# Patient Record
Sex: Female | Born: 2000 | Hispanic: No | Marital: Single | Smoking: Never smoker
Health system: Southern US, Community
[De-identification: ages and names within clinical notes are randomized; demographics above are authoritative.]

## PROBLEM LIST (undated history)

## (undated) DIAGNOSIS — D649 Anemia, unspecified: Secondary | ICD-10-CM

## (undated) DIAGNOSIS — O364XX0 Maternal care for intrauterine death, not applicable or unspecified: Secondary | ICD-10-CM

## (undated) HISTORY — DX: Maternal care for intrauterine death, not applicable or unspecified: O36.4XX0

## (undated) HISTORY — DX: Anemia, unspecified: D64.9

## (undated) HISTORY — PX: WISDOM TOOTH EXTRACTION: SHX21

---
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
## 2020-04-02 ENCOUNTER — Telehealth: Payer: Self-pay | Admitting: Family

## 2020-04-02 NOTE — Telephone Encounter (Signed)
Patient called stating she received a call from our office but did not get all the message.  I advised her of upcoming appointments that have been scheduled along with our address & phone number as well.  She voiced complete understanding as well.

## 2020-04-29 ENCOUNTER — Inpatient Hospital Stay: Payer: BC Managed Care – PPO | Attending: Family | Admitting: Family

## 2020-04-29 ENCOUNTER — Other Ambulatory Visit: Payer: Self-pay

## 2020-04-29 ENCOUNTER — Encounter: Payer: Self-pay | Admitting: Family

## 2020-04-29 ENCOUNTER — Other Ambulatory Visit: Payer: Self-pay | Admitting: Family

## 2020-04-29 ENCOUNTER — Inpatient Hospital Stay: Payer: BC Managed Care – PPO

## 2020-04-29 VITALS — BP 125/75 | HR 77 | Resp 18 | Ht <= 58 in | Wt 179.1 lb

## 2020-04-29 DIAGNOSIS — Z79899 Other long term (current) drug therapy: Secondary | ICD-10-CM | POA: Insufficient documentation

## 2020-04-29 DIAGNOSIS — G43909 Migraine, unspecified, not intractable, without status migrainosus: Secondary | ICD-10-CM | POA: Insufficient documentation

## 2020-04-29 DIAGNOSIS — Z885 Allergy status to narcotic agent status: Secondary | ICD-10-CM | POA: Insufficient documentation

## 2020-04-29 DIAGNOSIS — Z888 Allergy status to other drugs, medicaments and biological substances status: Secondary | ICD-10-CM | POA: Diagnosis not present

## 2020-04-29 DIAGNOSIS — R5383 Other fatigue: Secondary | ICD-10-CM | POA: Diagnosis not present

## 2020-04-29 DIAGNOSIS — D509 Iron deficiency anemia, unspecified: Secondary | ICD-10-CM | POA: Insufficient documentation

## 2020-04-29 DIAGNOSIS — D649 Anemia, unspecified: Secondary | ICD-10-CM

## 2020-04-29 DIAGNOSIS — R1031 Right lower quadrant pain: Secondary | ICD-10-CM | POA: Insufficient documentation

## 2020-04-29 DIAGNOSIS — R072 Precordial pain: Secondary | ICD-10-CM | POA: Insufficient documentation

## 2020-04-29 DIAGNOSIS — D500 Iron deficiency anemia secondary to blood loss (chronic): Secondary | ICD-10-CM

## 2020-04-29 LAB — CBC WITH DIFFERENTIAL (CANCER CENTER ONLY)
Abs Immature Granulocytes: 0.05 10*3/uL (ref 0.00–0.07)
Basophils Absolute: 0 10*3/uL (ref 0.0–0.1)
Basophils Relative: 0 %
Eosinophils Absolute: 0.1 10*3/uL (ref 0.0–0.5)
Eosinophils Relative: 1 %
HCT: 37.7 % (ref 36.0–46.0)
Hemoglobin: 11.7 g/dL — ABNORMAL LOW (ref 12.0–15.0)
Immature Granulocytes: 1 %
Lymphocytes Relative: 22 %
Lymphs Abs: 2 10*3/uL (ref 0.7–4.0)
MCH: 23.9 pg — ABNORMAL LOW (ref 26.0–34.0)
MCHC: 31 g/dL (ref 30.0–36.0)
MCV: 76.9 fL — ABNORMAL LOW (ref 80.0–100.0)
Monocytes Absolute: 0.5 10*3/uL (ref 0.1–1.0)
Monocytes Relative: 5 %
Neutro Abs: 6.7 10*3/uL (ref 1.7–7.7)
Neutrophils Relative %: 71 %
Platelet Count: 358 10*3/uL (ref 150–400)
RBC: 4.9 MIL/uL (ref 3.87–5.11)
RDW: 14.3 % (ref 11.5–15.5)
WBC Count: 9.4 10*3/uL (ref 4.0–10.5)
nRBC: 0 % (ref 0.0–0.2)

## 2020-04-29 LAB — CMP (CANCER CENTER ONLY)
ALT: 12 U/L (ref 0–44)
AST: 14 U/L — ABNORMAL LOW (ref 15–41)
Albumin: 4.1 g/dL (ref 3.5–5.0)
Alkaline Phosphatase: 89 U/L (ref 38–126)
Anion gap: 8 (ref 5–15)
BUN: 9 mg/dL (ref 6–20)
CO2: 26 mmol/L (ref 22–32)
Calcium: 9.5 mg/dL (ref 8.9–10.3)
Chloride: 104 mmol/L (ref 98–111)
Creatinine: 0.61 mg/dL (ref 0.44–1.00)
GFR, Estimated: >60 mL/min (ref >60)
Glucose, Bld: 107 mg/dL — ABNORMAL HIGH (ref 70–99)
Potassium: 3.8 mmol/L (ref 3.5–5.1)
Sodium: 138 mmol/L (ref 135–145)
Total Bilirubin: 0.3 mg/dL (ref 0.3–1.2)
Total Protein: 7.7 g/dL (ref 6.5–8.1)

## 2020-04-29 LAB — LACTATE DEHYDROGENASE: LDH: 150 U/L (ref 98–192)

## 2020-04-29 LAB — RETICULOCYTES
Immature Retic Fract: 25.1 % — ABNORMAL HIGH (ref 2.3–15.9)
RBC.: 4.81 MIL/uL (ref 3.87–5.11)
Retic Count, Absolute: 68.8 10*3/uL (ref 19.0–186.0)
Retic Ct Pct: 1.4 % (ref 0.4–3.1)

## 2020-04-29 LAB — SAVE SMEAR(SSMR), FOR PROVIDER SLIDE REVIEW

## 2020-04-29 NOTE — Progress Notes (Signed)
Hematology/Oncology Consultation   Name: Michelle ColasCatherine Knight      MRN: [id number]    Location: Room/bed info not found  Date: 04/29/2020 Time:3:18 PM   REFERRING PHYSICIAN: Carmen C. Mayo, PA-C  REASON FOR CONSULT: Iron deficiency anemia    DIAGNOSIS: Iron deficiency anemia    HISTORY OF PRESENT ILLNESS: Michelle Knight is a very pleasant 19 yo female with history of anemia. Her ferritin with her PCP recently was 4.  She has failed oral iron. No improvement while taking. She was adopted from [place] at 368 months old and has no known family history.  She is symptomatic with fatigue, arms feeling heavy, being cold natured and craving ice.  Her cycle is regular with normal flow. She states that she has been on her same birth control, Yasmin, for 2-3 years.  No other blood loss noted. No abnormal bruising, no petechiae.  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 She states that the autopsy on the still birth was negative.  No personal history of blood clot.  She has problems with frequent persistent migraines and is currently taking Imitrex. She states that she had two days where she had mid sternal chest pain recently. This resolved without intervention and she has had no other issues. I advised that if this returns she contact her PCP for further workup.  She has occasional right lower quadrant abdominal pain. This comes and goes.  She had all 4 wisdom teeth pulled at 19 yo without any complications.  No fever, n/v, cough, rash, dizziness, SOB, palpitations or changes in bowel or bladder habits.  No swelling or tenderness in her extremities.  She has positional numbness and tingling in her hands which she states she feels is due to carpal tunnel syndrome.   She does not smoke cigarettes. Her boyfriend dose smoke so she is exposed second hand. Rare alcohol use, only socially. She has maintained a good appetite and is staying well  hydrated. Her weight is stable.  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   ROS: All other 10 point review of systems is negative.   PAST MEDICAL HISTORY:   No past medical history on file.  ALLERGIES: Allergies  Allergen Reactions  . Other Other (See Comments)    Certain fruits, unsure which ones Grasses Dust mites  . Tramadol Hives      MEDICATIONS:  Current Outpatient Medications on File Prior to Visit  Medication Sig Dispense Refill  . albuterol (VENTOLIN HFA) 108 (90 Base) MCG/ACT inhaler Inhale into the lungs.    . drospirenone-ethinyl estradiol (YASMIN) 3-0.03 MG tablet drospirenone 3 mg-ethinyl estradiol 0.03 mg tablet  Take 1 tablet every day by oral route.    . ergocalciferol (VITAMIN D2) 1.25 MG (50000 UT) capsule Take 1 capsule by mouth once a week.    . escitalopram (LEXAPRO) 10 MG tablet Take by mouth.    . fluticasone (FLONASE) 50 MCG/ACT nasal spray fluticasone propionate 50 mcg/actuation nasal spray,suspension    . misoprostol (CYTOTEC) 200 MCG tablet Take by mouth.    . misoprostol (CYTOTEC) 200 MCG tablet Place vaginally.    . norgestimate-ethinyl estradiol (SPRINTEC 28) 0.25-35 MG-MCG tablet Take 1 tablet by mouth daily.    Marland Kitchen. oxyCODONE (OXY IR/ROXICODONE) 5 MG immediate release tablet Take 5 mg by mouth every 4 (four) hours as needed.    Marland Kitchen. PRENATAL MULTIVIT-MIN-FE-FA PO Take  1 tablet by mouth daily.    . SUMAtriptan (IMITREX) 25 MG tablet Take by mouth.    . topiramate (TOPAMAX) 50 MG tablet SMARTSIG:1 Tablet(s) By Mouth Every Evening    . traMADol (ULTRAM) 50 MG tablet tramadol 50 mg tablet  Take 1 tablet every 6 hours by oral route.    . Vitamin D, Ergocalciferol, (DRISDOL) 1.25 MG (50000 UNIT) CAPS capsule Take 50,000 Units by mouth once a week.     No current facility-administered medications on file prior to visit.     PAST SURGICAL HISTORY Wisdom teeth extraction at 19 years of age  FAMILY HISTORY: No  family history on file.  SOCIAL HISTORY:  reports that she has never smoked. She has never used smokeless tobacco. She reports previous alcohol use. She reports previous drug use.  PERFORMANCE STATUS: The patient's performance status is 1 - Symptomatic but completely ambulatory  PHYSICAL EXAM: Most Recent Vital Signs: Blood pressure 125/75, pulse 77, resp. rate 18, height 4\' 9"  (1.448 m), weight 179 lb 1.9 oz (81.2 kg), SpO2 99 %. BP 125/75 (BP Location: Left Arm, Patient Position: Sitting)   Pulse 77   Resp 18   Ht 4\' 9"  (1.448 m)   Wt 179 lb 1.9 oz (81.2 kg)   SpO2 99%   BMI 38.76 kg/m   General Appearance:    Alert, cooperative, no distress, appears stated age  Head:    Normocephalic, without obvious abnormality, atraumatic  Eyes:    PERRL, conjunctiva/corneas clear, EOM's intact, fundi    benign, both eyes        Throat:   Lips, mucosa, and tongue normal; teeth and gums normal  Neck:   Supple, symmetrical, trachea midline, no adenopathy;    thyroid:  no enlargement/tenderness/nodules; no carotid   bruit or JVD  Back:     Symmetric, no curvature, ROM normal, no CVA tenderness  Lungs:     Clear to auscultation bilaterally, respirations unlabored  Chest Wall:    No tenderness or deformity   Heart:    Regular rate and rhythm, S1 and S2 normal, no murmur, rub   or gallop     Abdomen:     Soft, non-tender, bowel sounds active all four quadrants,    no masses, no organomegaly        Extremities:   Extremities normal, atraumatic, no cyanosis or edema  Pulses:   2+ and symmetric all extremities  Skin:   Skin color, texture, turgor normal, no rashes or lesions  Lymph nodes:   Cervical, supraclavicular, and axillary nodes normal  Neurologic:   CNII-XII intact, normal strength, sensation and reflexes    throughout    LABORATORY DATA:  Results for orders placed or performed in visit on 04/29/20 (from the past 48 hour(s))  CBC with Differential (Cancer Center Only)     Status:  Abnormal   Collection Time: 04/29/20  1:42 PM  Result Value Ref Range   WBC Count 9.4 4.0 - 10.5 K/uL   RBC 4.90 3.87 - 5.11 MIL/uL   Hemoglobin 11.7 (L) 12.0 - 15.0 g/dL   HCT 05/01/20 36 - 46 %   MCV 76.9 (L) 80.0 - 100.0 fL   MCH 23.9 (L) 26.0 - 34.0 pg   MCHC 31.0 30.0 - 36.0 g/dL   RDW 05/01/20 24.4 - 62.8 %   Platelet Count 358 150 - 400 K/uL   nRBC 0.0 0.0 - 0.2 %   Neutrophils Relative % 71 %   Neutro Abs  6.7 1.7 - 7.7 K/uL   Lymphocytes Relative 22 %   Lymphs Abs 2.0 0.7 - 4.0 K/uL   Monocytes Relative 5 %   Monocytes Absolute 0.5 0.1 - 1.0 K/uL   Eosinophils Relative 1 %   Eosinophils Absolute 0.1 0.0 - 0.5 K/uL   Basophils Relative 0 %   Basophils Absolute 0.0 0.0 - 0.1 K/uL   Immature Granulocytes 1 %   Abs Immature Granulocytes 0.05 0.00 - 0.07 K/uL    Comment: Performed at [facility] at [facility], [address] [place], [place] [address]  CMP (Cancer Center only)     Status: Abnormal   Collection Time: 04/29/20  1:42 PM  Result Value Ref Range   Sodium 138 135 - 145 mmol/L   Potassium 3.8 3.5 - 5.1 mmol/L   Chloride 104 98 - 111 mmol/L   CO2 26 22 - 32 mmol/L   Glucose, Bld 107 (H) 70 - 99 mg/dL    Comment: Glucose reference range applies only to samples taken after fasting for at least 8 hours.   BUN 9 6 - 20 mg/dL   Creatinine 6.19 5.09 - 1.00 mg/dL   Calcium 9.5 8.9 - 32.6 mg/dL   Total Protein 7.7 6.5 - 8.1 g/dL   Albumin 4.1 3.5 - 5.0 g/dL   AST 14 (L) 15 - 41 U/L   ALT 12 0 - 44 U/L   Alkaline Phosphatase 89 38 - 126 U/L   Total Bilirubin 0.3 0.3 - 1.2 mg/dL   GFR, Estimated >71 >24 mL/min    Comment: (NOTE) Calculated using the CKD-EPI Creatinine Equation (2021)    Anion gap 8 5 - 15    Comment: Performed at [facility] at [facility], [address] [place], [place] [address]  Reticulocytes     Status: Abnormal   Collection Time: 04/29/20  1:42 PM  Result Value Ref Range   Retic Ct Pct 1.4  0.4 - 3.1 %   RBC. 4.81 3.87 - 5.11 MIL/uL   Retic Count, Absolute 68.8 19.0 - 186.0 K/uL   Immature Retic Fract 25.1 (H) 2.3 - 15.9 %    Comment: Performed at [facility] at [facility], [address], [place], [place] [address]  Save Smear [facility])     Status: None   Collection Time: 04/29/20  1:42 PM  Result Value Ref Range   Smear Review SMEAR STAINED AND AVAILABLE FOR REVIEW     Comment: Performed at [facility] at [facility], [address], [place], [place] [address]  Lactate dehydrogenase (LDH)     Status: None   Collection Time: 04/29/20  1:43 PM  Result Value Ref Range   LDH 150 98 - 192 U/L    Comment: Performed at [facility] Lab at [facility], [address], [place], [place] [address]      RADIOGRAPHY: No results found.     PATHOLOGY: None  ASSESSMENT/PLAN: Michelle Knight is a very pleasant 19 yo female with history of anemia.  Lab work is currently pending.  She could possibly benefit from IV iron as well as a daily folic acid supplement.  We will contact her once results are available to go over treatment plan.  Follow-up in 8 weeks.   All questions were answered and she is in agreement with the plan. She was encouraged to  contact our office with any questions or concerns. We can certainly see her sooner if needed.   The patient was discussed with Dr. Myna Hidalgo and he is in agreement with the aforementioned.   Emeline Gins, NP

## 2020-04-30 ENCOUNTER — Other Ambulatory Visit: Payer: Self-pay | Admitting: Family

## 2020-04-30 DIAGNOSIS — D509 Iron deficiency anemia, unspecified: Secondary | ICD-10-CM | POA: Insufficient documentation

## 2020-04-30 LAB — IRON AND TIBC
Iron: 25 ug/dL — ABNORMAL LOW (ref 41–142)
Saturation Ratios: 4 % — ABNORMAL LOW (ref 21–57)
TIBC: 585 ug/dL — ABNORMAL HIGH (ref 236–444)
UIBC: 560 ug/dL — ABNORMAL HIGH (ref 120–384)

## 2020-04-30 LAB — FERRITIN: Ferritin: <4 ng/mL — ABNORMAL LOW (ref 11–307)

## 2020-04-30 LAB — ERYTHROPOIETIN: Erythropoietin: 35.1 m[IU]/mL — ABNORMAL HIGH (ref 2.6–18.5)

## 2020-05-01 ENCOUNTER — Telehealth: Payer: Self-pay | Admitting: Family

## 2020-05-01 NOTE — Telephone Encounter (Signed)
Called and spoke with patient regarding iron infusion appointments that have been added per 10/26 sch msg.  She was ok with all dates & times scheduled

## 2020-05-05 ENCOUNTER — Other Ambulatory Visit: Payer: Self-pay

## 2020-05-05 ENCOUNTER — Inpatient Hospital Stay: Payer: BC Managed Care – PPO | Attending: Family

## 2020-05-05 VITALS — BP 112/84 | HR 84 | Temp 98.4°F | Resp 18

## 2020-05-05 DIAGNOSIS — D509 Iron deficiency anemia, unspecified: Secondary | ICD-10-CM | POA: Insufficient documentation

## 2020-05-05 MED ORDER — SODIUM CHLORIDE 0.9 % IV SOLN
200.0000 mg | Freq: Once | INTRAVENOUS | Status: AC
  Administered 2020-05-05: 200 mg via INTRAVENOUS
  Filled 2020-05-05: qty 200

## 2020-05-05 MED ORDER — SODIUM CHLORIDE 0.9 % IV SOLN
Freq: Once | INTRAVENOUS | Status: AC
  Filled 2020-05-05: qty 250

## 2020-05-07 ENCOUNTER — Inpatient Hospital Stay: Payer: BC Managed Care – PPO

## 2020-05-07 ENCOUNTER — Other Ambulatory Visit: Payer: Self-pay

## 2020-05-07 VITALS — BP 127/62 | HR 89 | Temp 98.9°F | Resp 18

## 2020-05-07 DIAGNOSIS — D509 Iron deficiency anemia, unspecified: Secondary | ICD-10-CM

## 2020-05-07 MED ORDER — SODIUM CHLORIDE 0.9 % IV SOLN
Freq: Once | INTRAVENOUS | Status: AC
  Filled 2020-05-07: qty 250

## 2020-05-07 MED ORDER — SODIUM CHLORIDE 0.9 % IV SOLN
200.0000 mg | Freq: Once | INTRAVENOUS | Status: AC
  Administered 2020-05-07: 200 mg via INTRAVENOUS
  Filled 2020-05-07: qty 200

## 2020-05-07 NOTE — Patient Instructions (Signed)

## 2020-05-07 NOTE — Progress Notes (Signed)
Pt discharged in no apparent distress. Pt left ambulatory without assistance. Pt aware of discharge instructions and verbalized understanding and had no further questions.  

## 2020-05-13 ENCOUNTER — Other Ambulatory Visit: Payer: Self-pay

## 2020-05-13 ENCOUNTER — Inpatient Hospital Stay: Payer: BC Managed Care – PPO

## 2020-05-13 VITALS — BP 103/70 | HR 70 | Temp 98.1°F | Resp 17

## 2020-05-13 DIAGNOSIS — D509 Iron deficiency anemia, unspecified: Secondary | ICD-10-CM | POA: Diagnosis not present

## 2020-05-13 MED ORDER — SODIUM CHLORIDE 0.9 % IV SOLN
200.0000 mg | Freq: Once | INTRAVENOUS | Status: AC
  Administered 2020-05-13: 200 mg via INTRAVENOUS
  Filled 2020-05-13: qty 200

## 2020-05-13 MED ORDER — SODIUM CHLORIDE 0.9 % IV SOLN
Freq: Once | INTRAVENOUS | Status: AC
  Filled 2020-05-13: qty 250

## 2020-05-13 NOTE — Progress Notes (Signed)
Pt discharged in no apparent distress. Pt left ambulatory without assistance. Pt aware of discharge instructions and verbalized understanding and had no further questions.  

## 2020-05-16 ENCOUNTER — Inpatient Hospital Stay: Payer: BC Managed Care – PPO

## 2020-05-16 ENCOUNTER — Other Ambulatory Visit: Payer: Self-pay

## 2020-05-16 VITALS — BP 98/74 | HR 85 | Temp 99.0°F | Resp 16

## 2020-05-16 DIAGNOSIS — D509 Iron deficiency anemia, unspecified: Secondary | ICD-10-CM

## 2020-05-16 MED ORDER — SODIUM CHLORIDE 0.9 % IV SOLN
200.0000 mg | Freq: Once | INTRAVENOUS | Status: AC
  Administered 2020-05-16: 200 mg via INTRAVENOUS
  Filled 2020-05-16: qty 200

## 2020-05-16 MED ORDER — SODIUM CHLORIDE 0.9 % IV SOLN
Freq: Once | INTRAVENOUS | Status: AC
  Filled 2020-05-16: qty 250

## 2020-05-16 NOTE — Progress Notes (Signed)
Pt discharged in no apparent distress. Pt left ambulatory without assistance. Pt aware of discharge instructions and verbalized understanding and had no further questions.  

## 2020-05-16 NOTE — Patient Instructions (Signed)
Iron Sucrose injection (Venofer)  What is this medicine? IRON SUCROSE (AHY ern SOO krohs) is an iron complex. Iron is used to make healthy red blood cells, which carry oxygen and nutrients throughout the body. This medicine is used to treat iron deficiency anemia in people with chronic kidney disease. This medicine may be used for other purposes; ask your health care provider or pharmacist if you have questions. COMMON BRAND NAME(S): Venofer What should I tell my health care provider before I take this medicine? They need to know if you have any of these conditions:  anemia not caused by low iron levels  heart disease  high levels of iron in the blood  kidney disease  liver disease  an unusual or allergic reaction to iron, other medicines, foods, dyes, or preservatives  pregnant or trying to get pregnant  breast-feeding How should I use this medicine? This medicine is for infusion into a vein. It is given by a health care professional in a hospital or clinic setting. Talk to your pediatrician regarding the use of this medicine in children. While this drug may be prescribed for children as young as 2 years for selected conditions, precautions do apply. Overdosage: If you think you have taken too much of this medicine contact a poison control center or emergency room at once. NOTE: This medicine is only for you. Do not share this medicine with others. What if I miss a dose? It is important not to miss your dose. Call your doctor or health care professional if you are unable to keep an appointment. What may interact with this medicine? Do not take this medicine with any of the following medications:  deferoxamine  dimercaprol  other iron products This medicine may also interact with the following medications:  chloramphenicol  deferasirox This list may not describe all possible interactions. Give your health care provider a list of all the medicines, herbs, non-prescription  drugs, or dietary supplements you use. Also tell them if you smoke, drink alcohol, or use illegal drugs. Some items may interact with your medicine. What should I watch for while using this medicine? Visit your doctor or healthcare professional regularly. Tell your doctor or healthcare professional if your symptoms do not start to get better or if they get worse. You may need blood work done while you are taking this medicine. You may need to follow a special diet. Talk to your doctor. Foods that contain iron include: whole grains/cereals, dried fruits, beans, or peas, leafy green vegetables, and organ meats (liver, kidney). What side effects may I notice from receiving this medicine? Side effects that you should report to your doctor or health care professional as soon as possible:  allergic reactions like skin rash, itching or hives, swelling of the face, lips, or tongue  breathing problems  changes in blood pressure  cough  fast, irregular heartbeat  feeling faint or lightheaded, falls  fever or chills  flushing, sweating, or hot feelings  joint or muscle aches/pains  seizures  swelling of the ankles or feet  unusually weak or tired Side effects that usually do not require medical attention (report to your doctor or health care professional if they continue or are bothersome):  diarrhea  feeling achy  headache  irritation at site where injected  nausea, vomiting  stomach upset  tiredness This list may not describe all possible side effects. Call your doctor for medical advice about side effects. You may report side effects to FDA at 1-800-FDA-1088. Where should   I keep my medicine? This drug is given in a hospital or clinic and will not be stored at home. NOTE: This sheet is a summary. It may not cover all possible information. If you have questions about this medicine, talk to your doctor, pharmacist, or health care provider.  2020 Elsevier/Gold Standard  (2011-04-01 17:14:35)  

## 2020-05-19 ENCOUNTER — Other Ambulatory Visit: Payer: Self-pay

## 2020-05-19 ENCOUNTER — Inpatient Hospital Stay: Payer: BC Managed Care – PPO

## 2020-05-19 VITALS — BP 112/71 | HR 68 | Temp 98.4°F | Resp 17

## 2020-05-19 DIAGNOSIS — D509 Iron deficiency anemia, unspecified: Secondary | ICD-10-CM

## 2020-05-19 MED ORDER — SODIUM CHLORIDE 0.9 % IV SOLN
Freq: Once | INTRAVENOUS | Status: AC
  Filled 2020-05-19: qty 250

## 2020-05-19 MED ORDER — SODIUM CHLORIDE 0.9 % IV SOLN
200.0000 mg | Freq: Once | INTRAVENOUS | Status: AC
  Administered 2020-05-19: 200 mg via INTRAVENOUS
  Filled 2020-05-19: qty 200

## 2020-05-19 NOTE — Progress Notes (Signed)
Pt discharged in no apparent distress. Pt left ambulatory without assistance. Pt aware of discharge instructions and verbalized understanding and had no further questions.  

## 2020-06-30 ENCOUNTER — Other Ambulatory Visit: Payer: Self-pay

## 2020-06-30 ENCOUNTER — Inpatient Hospital Stay: Payer: BC Managed Care – PPO | Attending: Family

## 2020-06-30 ENCOUNTER — Telehealth: Payer: Self-pay | Admitting: Family

## 2020-06-30 ENCOUNTER — Encounter: Payer: Self-pay | Admitting: Family

## 2020-06-30 ENCOUNTER — Inpatient Hospital Stay (HOSPITAL_BASED_OUTPATIENT_CLINIC_OR_DEPARTMENT_OTHER): Payer: BC Managed Care – PPO | Admitting: Family

## 2020-06-30 VITALS — BP 117/66 | HR 60 | Temp 98.1°F | Resp 18 | Ht <= 58 in | Wt 186.4 lb

## 2020-06-30 DIAGNOSIS — R519 Headache, unspecified: Secondary | ICD-10-CM | POA: Insufficient documentation

## 2020-06-30 DIAGNOSIS — Z885 Allergy status to narcotic agent status: Secondary | ICD-10-CM | POA: Insufficient documentation

## 2020-06-30 DIAGNOSIS — Z888 Allergy status to other drugs, medicaments and biological substances status: Secondary | ICD-10-CM | POA: Diagnosis not present

## 2020-06-30 DIAGNOSIS — R0602 Shortness of breath: Secondary | ICD-10-CM | POA: Diagnosis not present

## 2020-06-30 DIAGNOSIS — D509 Iron deficiency anemia, unspecified: Secondary | ICD-10-CM | POA: Insufficient documentation

## 2020-06-30 DIAGNOSIS — Z79899 Other long term (current) drug therapy: Secondary | ICD-10-CM | POA: Insufficient documentation

## 2020-06-30 DIAGNOSIS — D500 Iron deficiency anemia secondary to blood loss (chronic): Secondary | ICD-10-CM

## 2020-06-30 LAB — CBC WITH DIFFERENTIAL (CANCER CENTER ONLY)
Abs Immature Granulocytes: 0.06 10*3/uL (ref 0.00–0.07)
Basophils Absolute: 0 10*3/uL (ref 0.0–0.1)
Basophils Relative: 0 %
Eosinophils Absolute: 0.1 10*3/uL (ref 0.0–0.5)
Eosinophils Relative: 1 %
HCT: 42.1 % (ref 36.0–46.0)
Hemoglobin: 13.8 g/dL (ref 12.0–15.0)
Immature Granulocytes: 1 %
Lymphocytes Relative: 25 %
Lymphs Abs: 2.2 10*3/uL (ref 0.7–4.0)
MCH: 25.7 pg — ABNORMAL LOW (ref 26.0–34.0)
MCHC: 32.8 g/dL (ref 30.0–36.0)
MCV: 78.5 fL — ABNORMAL LOW (ref 80.0–100.0)
Monocytes Absolute: 0.5 10*3/uL (ref 0.1–1.0)
Monocytes Relative: 6 %
Neutro Abs: 6 10*3/uL (ref 1.7–7.7)
Neutrophils Relative %: 67 %
Platelet Count: 255 10*3/uL (ref 150–400)
RBC: 5.36 MIL/uL — ABNORMAL HIGH (ref 3.87–5.11)
RDW: 21.7 % — ABNORMAL HIGH (ref 11.5–15.5)
WBC Count: 9 10*3/uL (ref 4.0–10.5)
nRBC: 0 % (ref 0.0–0.2)

## 2020-06-30 LAB — RETICULOCYTES
Immature Retic Fract: 15.8 % (ref 2.3–15.9)
RBC.: 5.4 MIL/uL — ABNORMAL HIGH (ref 3.87–5.11)
Retic Count, Absolute: 106.9 10*3/uL (ref 19.0–186.0)
Retic Ct Pct: 2 % (ref 0.4–3.1)

## 2020-06-30 NOTE — Progress Notes (Signed)
Hematology and Oncology Follow Up Visit  Michelle Knight [id number] [birth year] 19 y.o. 06/30/2020   Principle Diagnosis:  Iron deficiency anemia   Current Therapy:   IV iron as indicated    Interim History:  Michelle Knight is here today for follow-up. She is feeling much better since receiving IV iron. Her symptoms are much improved.  She has occasional SOB at the end of a long day at work.  No abnormal blood loss. Her last cycle was a month ago. No bruising or petechiae.  No fever, chills, n/v, cough, rash, dizziness, chest pain, palpitations, abdominal pain or changes in bowel or bladder habits.  She has noticed that her headaches have been less often and tolerable.  No swelling, tenderness, numbness or tingling in her extremities.  No falls or syncope.  She has maintained a good appetite and is staying well hydrated. Her weight is stable.   ECOG Performance Status: 1 - Symptomatic but completely ambulatory  Medications:  Allergies as of 06/30/2020      Reactions   Other Itching, Swelling, Other (See Comments)   Certain exotic fruits...mango, dragonfruit, papaya,etc. Tongue swells.   Mixed Grasses Itching   Tramadol Hives      Medication List       Accurate as of June 30, 2020  2:46 PM. If you have any questions, ask your nurse or doctor.        STOP taking these medications   drospirenone-ethinyl estradiol 3-0.03 MG tablet Commonly known as: YASMIN Stopped by: Michelle Gins, NP   misoprostol 200 MCG tablet Commonly known as: CYTOTEC Stopped by: Michelle Gins, NP   oxyCODONE 5 MG immediate release tablet Commonly known as: Oxy IR/ROXICODONE Stopped by: Michelle Gins, NP   PRENATAL MULTIVIT-MIN-FE-FA PO Stopped by: Michelle Gins, NP   traMADol 50 MG tablet Commonly known as: ULTRAM Stopped by: Michelle Gins, NP     TAKE these medications   albuterol 108 (90 Base) MCG/ACT inhaler Commonly known as: VENTOLIN HFA Inhale into the  lungs.   ergocalciferol 1.25 MG (50000 UT) capsule Commonly known as: VITAMIN D2 Take 1 capsule by mouth once a week.   Vitamin D (Ergocalciferol) 1.25 MG (50000 UNIT) Caps capsule Commonly known as: DRISDOL Take 50,000 Units by mouth once a week.   escitalopram 10 MG tablet Commonly known as: LEXAPRO Take by mouth.   fluticasone 50 MCG/ACT nasal spray Commonly known as: FLONASE fluticasone propionate 50 mcg/actuation nasal spray,suspension   norgestimate-ethinyl estradiol 0.25-35 MG-MCG tablet Commonly known as: ORTHO-CYCLEN Take 1 tablet by mouth daily.   SUMAtriptan 25 MG tablet Commonly known as: IMITREX Take by mouth.   topiramate 50 MG tablet Commonly known as: TOPAMAX SMARTSIG:1 Tablet(s) By Mouth Every Evening       Allergies:  Allergies  Allergen Reactions  . Other Itching, Swelling and Other (See Comments)    Certain exotic fruits...mango, dragonfruit, papaya,etc. Tongue swells.  . Mixed Grasses Itching  . Tramadol Hives    Past Medical History, Surgical history, Social history, and Family History were reviewed and updated.  Review of Systems: All other 10 point review of systems is negative.   Physical Exam:  height is 4\' 9"  (1.448 m) and weight is 186 lb 6.4 oz (84.6 kg). Her oral temperature is 98.1 F (36.7 C). Her blood pressure is 117/66 and her pulse is 60. Her respiration is 18 and oxygen saturation is 100%.   Wt Readings from Last 3 Encounters:  06/30/20 186 lb 6.4 oz (84.6 kg)  04/29/20  179 lb 1.9 oz (81.2 kg)    Ocular: Sclerae unicteric, pupils equal, round and reactive to light Ear-nose-throat: Oropharynx clear, dentition fair Lymphatic: No cervical or supraclavicular adenopathy Lungs no rales or rhonchi, good excursion bilaterally Heart regular rate and rhythm, no murmur appreciated Abd soft, nontender, positive bowel sounds MSK no focal spinal tenderness, no joint edema Neuro: non-focal, well-oriented, appropriate  affect Breasts: Deferred   Lab Results  Component Value Date   WBC 9.0 06/30/2020   HGB 13.8 06/30/2020   HCT 42.1 06/30/2020   MCV 78.5 (L) 06/30/2020   PLT 255 06/30/2020   Lab Results  Component Value Date   FERRITIN <4 (L) 04/29/2020   IRON 25 (L) 04/29/2020   TIBC 585 (H) 04/29/2020   UIBC 560 (H) 04/29/2020   IRONPCTSAT 4 (L) 04/29/2020   Lab Results  Component Value Date   RETICCTPCT 2.0 06/30/2020   RBC 5.36 (H) 06/30/2020   RBC 5.40 (H) 06/30/2020   No results found for: KPAFRELGTCHN, LAMBDASER, KAPLAMBRATIO No results found for: IGGSERUM, IGA, IGMSERUM No results found for: Marda Stalker, SPEI   Chemistry      Component Value Date/Time   NA 138 04/29/2020 1342   K 3.8 04/29/2020 1342   CL 104 04/29/2020 1342   CO2 26 04/29/2020 1342   BUN 9 04/29/2020 1342   CREATININE 0.61 04/29/2020 1342      Component Value Date/Time   CALCIUM 9.5 04/29/2020 1342   ALKPHOS 89 04/29/2020 1342   AST 14 (L) 04/29/2020 1342   ALT 12 04/29/2020 1342   BILITOT 0.3 04/29/2020 1342       Impression and Plan: Michelle Knight is a very pleasant 19 yo female with iron deficiency anemia. She has had a nice response to far to IV iron.  We will see what her iron studies look like and replace again if needed.  Follow-up in 3 months.  She was encouraged to contact our office with any questions or concerns.   Michelle Gins, NP 12/27/20212:46 PM

## 2020-06-30 NOTE — Telephone Encounter (Signed)
Appointments scheduled calendar printed & mailed per 12/27 los

## 2020-07-01 LAB — IRON AND TIBC
Iron: 114 ug/dL (ref 41–142)
Saturation Ratios: 30 % (ref 21–57)
TIBC: 383 ug/dL (ref 236–444)
UIBC: 269 ug/dL (ref 120–384)

## 2020-07-01 LAB — FERRITIN: Ferritin: 123 ng/mL (ref 11–307)

## 2020-07-05 NOTE — L&D Delivery Note (Signed)
OB/GYN Faculty Practice Delivery and Post Placental IUD placement Note  Michelle Knight is a 20 y.o. G4P1020 s/p SVD at [redacted]w[redacted]d. She was admitted for IOL for hx of IUFD in prior pregnancy at 38 weeks.   ROM: 8h 51m with clear fluid GBS Status: Neg Maximum Maternal Temperature: 99.6  Labor Progress: Presented for IOL, received cytotecx1 and had a foley balloon placed. Once the foley balloon was out was started on pitocin. Due to some tachysystole her pitocin was stopped and she contracted on her own and progressed to complete.  Delivery Date/Time: 0208 on 06/09/2021 Delivery: Called to room and patient was complete and pushing. Head delivered OA. No nuchal cord present. Shoulder and body delivered in usual fashion. Infant initially with soft spontaneous cry, placed on mother's abdomen, dried and stimulated. Cord clamped x 2 after 1-minute delay, and cut by father of baby. At this time infant stimulated further by RN staff and found to have less tone and starting to grunt and dusky color and he was taken to the warmer approximately 1 min after cord was cut. Ultimately NICU team was called and baby was taken to NICU for respiratory support.   Cord blood drawn. Placenta delivered spontaneously with gentle cord traction. Fundus firm with massage and Pitocin. Due to floor acuity staffing in room being short and attention of staff being needed by baby there was ~5 min delay in placing the post placental IUD after delivery of placenta. Liletta IUD placed with hands by Dr. Ephriam Jenkins. Strings trimmed 1cm inside the introitus. Labia, perineum, vagina, and cervix inspected and found  to have a right periurethral as well as a midline periurethral, both were repaired with interrupted sutures.  Placenta: intact, 3V cord, L&D Complications: None Lacerations: right periurethral EBL: 180cc Analgesia: epidural   Infant: female  APGARs 5,5  weight pending  Warner Mccreedy, MD, MPH OB Fellow, Faculty Practice Center for  [facility], [facility] 06/09/2021, 2:48 AM

## 2020-09-29 ENCOUNTER — Encounter: Payer: Self-pay | Admitting: Family

## 2020-09-29 ENCOUNTER — Inpatient Hospital Stay (HOSPITAL_BASED_OUTPATIENT_CLINIC_OR_DEPARTMENT_OTHER): Payer: BC Managed Care – PPO | Admitting: Family

## 2020-09-29 ENCOUNTER — Other Ambulatory Visit: Payer: Self-pay

## 2020-09-29 ENCOUNTER — Inpatient Hospital Stay: Payer: BC Managed Care – PPO | Attending: Hematology & Oncology

## 2020-09-29 VITALS — BP 104/62 | HR 65 | Temp 98.4°F | Resp 18 | Ht <= 58 in | Wt 187.0 lb

## 2020-09-29 DIAGNOSIS — R42 Dizziness and giddiness: Secondary | ICD-10-CM | POA: Diagnosis not present

## 2020-09-29 DIAGNOSIS — Z888 Allergy status to other drugs, medicaments and biological substances status: Secondary | ICD-10-CM | POA: Diagnosis not present

## 2020-09-29 DIAGNOSIS — R0602 Shortness of breath: Secondary | ICD-10-CM | POA: Insufficient documentation

## 2020-09-29 DIAGNOSIS — Z79899 Other long term (current) drug therapy: Secondary | ICD-10-CM | POA: Diagnosis not present

## 2020-09-29 DIAGNOSIS — R5383 Other fatigue: Secondary | ICD-10-CM | POA: Diagnosis not present

## 2020-09-29 DIAGNOSIS — R200 Anesthesia of skin: Secondary | ICD-10-CM | POA: Diagnosis not present

## 2020-09-29 DIAGNOSIS — D509 Iron deficiency anemia, unspecified: Secondary | ICD-10-CM | POA: Insufficient documentation

## 2020-09-29 DIAGNOSIS — R202 Paresthesia of skin: Secondary | ICD-10-CM | POA: Diagnosis not present

## 2020-09-29 DIAGNOSIS — Z885 Allergy status to narcotic agent status: Secondary | ICD-10-CM | POA: Diagnosis not present

## 2020-09-29 DIAGNOSIS — D500 Iron deficiency anemia secondary to blood loss (chronic): Secondary | ICD-10-CM

## 2020-09-29 LAB — CBC WITH DIFFERENTIAL (CANCER CENTER ONLY)
Abs Immature Granulocytes: 0.16 10*3/uL — ABNORMAL HIGH (ref 0.00–0.07)
Basophils Absolute: 0 10*3/uL (ref 0.0–0.1)
Basophils Relative: 0 %
Eosinophils Absolute: 0.1 10*3/uL (ref 0.0–0.5)
Eosinophils Relative: 1 %
HCT: 44 % (ref 36.0–46.0)
Hemoglobin: 14.3 g/dL (ref 12.0–15.0)
Immature Granulocytes: 2 %
Lymphocytes Relative: 25 %
Lymphs Abs: 2.7 10*3/uL (ref 0.7–4.0)
MCH: 27.9 pg (ref 26.0–34.0)
MCHC: 32.5 g/dL (ref 30.0–36.0)
MCV: 85.9 fL (ref 80.0–100.0)
Monocytes Absolute: 0.8 10*3/uL (ref 0.1–1.0)
Monocytes Relative: 7 %
Neutro Abs: 7.1 10*3/uL (ref 1.7–7.7)
Neutrophils Relative %: 65 %
Platelet Count: 261 10*3/uL (ref 150–400)
RBC: 5.12 MIL/uL — ABNORMAL HIGH (ref 3.87–5.11)
RDW: 12.9 % (ref 11.5–15.5)
WBC Count: 10.9 10*3/uL — ABNORMAL HIGH (ref 4.0–10.5)
nRBC: 0 % (ref 0.0–0.2)

## 2020-09-29 LAB — RETICULOCYTES
Immature Retic Fract: 14.1 % (ref 2.3–15.9)
RBC.: 5.01 MIL/uL (ref 3.87–5.11)
Retic Count, Absolute: 115.7 10*3/uL (ref 19.0–186.0)
Retic Ct Pct: 2.3 % (ref 0.4–3.1)

## 2020-09-29 NOTE — Progress Notes (Signed)
Hematology and Oncology Follow Up Visit  Michelle Knight [id number] October 22, 2000 20 y.o. 09/29/2020   Principle Diagnosis:  Iron deficiency anemia  Current Therapy:        IV iron as indicated    Interim History:  Michelle Knight is here today for follow-up. She is symptomatic with fatigue, dizziness and SOB at times when talking.  Her cycle is regular with normal flow. No other blood loss noted. No bruising or petechiae.  She has positional numbness and tingling in her hands when laying down.  No swelling or tenderness in her extremities. No syncope or falls to report.  No fever, chills, n/v, cough, rash, chest pain, palpitations, abdominal pain or changes in bowel or bladder habits.  She has maintained a good appetite and is staying well hydrated. Her weight is stable at   ECOG Performance Status: 1 - Symptomatic but completely ambulatory  Medications:  Allergies as of 09/29/2020      Reactions   Other Itching, Swelling, Other (See Comments)   Certain exotic fruits...mango, dragonfruit, papaya,etc. Tongue swells.   Mixed Grasses Itching   Tramadol Hives      Medication List       Accurate as of September 29, 2020  1:38 PM. If you have any questions, ask your nurse or doctor.        albuterol 108 (90 Base) MCG/ACT inhaler Commonly known as: VENTOLIN HFA Inhale into the lungs.   ergocalciferol 1.25 MG (50000 UT) capsule Commonly known as: VITAMIN D2 Take 1 capsule by mouth once a week.   Vitamin D (Ergocalciferol) 1.25 MG (50000 UNIT) Caps capsule Commonly known as: DRISDOL Take 50,000 Units by mouth once a week.   escitalopram 10 MG tablet Commonly known as: LEXAPRO Take by mouth.   fluticasone 50 MCG/ACT nasal spray Commonly known as: FLONASE fluticasone propionate 50 mcg/actuation nasal spray,suspension   norgestimate-ethinyl estradiol 0.25-35 MG-MCG tablet Commonly known as: ORTHO-CYCLEN Take 1 tablet by mouth daily.   SUMAtriptan 25 MG tablet Commonly  known as: IMITREX Take by mouth.   topiramate 50 MG tablet Commonly known as: TOPAMAX SMARTSIG:1 Tablet(s) By Mouth Every Evening       Allergies:  Allergies  Allergen Reactions  . Other Itching, Swelling and Other (See Comments)    Certain exotic fruits...mango, dragonfruit, papaya,etc. Tongue swells.  . Mixed Grasses Itching  . Tramadol Hives    Past Medical History, Surgical history, Social history, and Family History were reviewed and updated.  Review of Systems: All other 10 point review of systems is negative.   Physical Exam:  vitals were not taken for this visit.   Wt Readings from Last 3 Encounters:  06/30/20 186 lb 6.4 oz (84.6 kg)  04/29/20 179 lb 1.9 oz (81.2 kg)    Ocular: Sclerae unicteric, pupils equal, round and reactive to light Ear-nose-throat: Oropharynx clear, dentition fair Lymphatic: No cervical or supraclavicular adenopathy Lungs no rales or rhonchi, good excursion bilaterally Heart regular rate and rhythm, no murmur appreciated Abd soft, nontender, positive bowel sounds MSK no focal spinal tenderness, no joint edema Neuro: non-focal, well-oriented, appropriate affect Breasts: Deferred   Lab Results  Component Value Date   WBC 10.9 (H) 09/29/2020   HGB 14.3 09/29/2020   HCT 44.0 09/29/2020   MCV 85.9 09/29/2020   PLT 261 09/29/2020   Lab Results  Component Value Date   FERRITIN 123 06/30/2020   IRON 114 06/30/2020   TIBC 383 06/30/2020   UIBC 269 06/30/2020   IRONPCTSAT 30 06/30/2020  Lab Results  Component Value Date   RETICCTPCT 2.3 09/29/2020   RBC 5.01 09/29/2020   No results found for: KPAFRELGTCHN, LAMBDASER, KAPLAMBRATIO No results found for: IGGSERUM, IGA, IGMSERUM No results found for: Marda Stalker, SPEI   Chemistry      Component Value Date/Time   NA 138 04/29/2020 1342   K 3.8 04/29/2020 1342   CL 104 04/29/2020 1342   CO2 26 04/29/2020 1342   BUN 9  04/29/2020 1342   CREATININE 0.61 04/29/2020 1342      Component Value Date/Time   CALCIUM 9.5 04/29/2020 1342   ALKPHOS 89 04/29/2020 1342   AST 14 (L) 04/29/2020 1342   ALT 12 04/29/2020 1342   BILITOT 0.3 04/29/2020 1342       Impression and Plan: Michelle Knight is a very pleasant 20 yo female with iron deficiency anemia.  Iron studies are pending. We will get her set up for IV iron if needed.  Follow-up in 3 months.  She can contact our office with any questions or concerns.   Emeline Gins, NP 3/28/20221:38 PM

## 2020-09-30 LAB — IRON AND TIBC
Iron: 122 ug/dL (ref 41–142)
Saturation Ratios: 31 % (ref 21–57)
TIBC: 389 ug/dL (ref 236–444)
UIBC: 267 ug/dL (ref 120–384)

## 2020-09-30 LAB — FERRITIN: Ferritin: 81 ng/mL (ref 11–307)

## 2020-11-05 ENCOUNTER — Encounter: Payer: Self-pay | Admitting: *Deleted

## 2020-11-10 DIAGNOSIS — O0990 Supervision of high risk pregnancy, unspecified, unspecified trimester: Secondary | ICD-10-CM | POA: Insufficient documentation

## 2020-11-11 ENCOUNTER — Other Ambulatory Visit: Payer: Self-pay

## 2020-11-11 ENCOUNTER — Ambulatory Visit (INDEPENDENT_AMBULATORY_CARE_PROVIDER_SITE_OTHER): Payer: BC Managed Care – PPO

## 2020-11-11 ENCOUNTER — Other Ambulatory Visit (HOSPITAL_COMMUNITY)
Admission: RE | Admit: 2020-11-11 | Discharge: 2020-11-11 | Disposition: A | Discharge status: Home / Self Care | Payer: BC Managed Care – PPO | Source: Ambulatory Visit

## 2020-11-11 VITALS — BP 110/62 | HR 69 | Wt 185.0 lb

## 2020-11-11 DIAGNOSIS — O09291 Supervision of pregnancy with other poor reproductive or obstetric history, first trimester: Secondary | ICD-10-CM | POA: Diagnosis not present

## 2020-11-11 DIAGNOSIS — Z8759 Personal history of other complications of pregnancy, childbirth and the puerperium: Secondary | ICD-10-CM | POA: Insufficient documentation

## 2020-11-11 DIAGNOSIS — Z3A08 8 weeks gestation of pregnancy: Secondary | ICD-10-CM

## 2020-11-11 DIAGNOSIS — Z8632 Personal history of gestational diabetes: Secondary | ICD-10-CM | POA: Insufficient documentation

## 2020-11-11 DIAGNOSIS — O0990 Supervision of high risk pregnancy, unspecified, unspecified trimester: Secondary | ICD-10-CM

## 2020-11-11 DIAGNOSIS — Z862 Personal history of diseases of the blood and blood-forming organs and certain disorders involving the immune mechanism: Secondary | ICD-10-CM | POA: Insufficient documentation

## 2020-11-11 DIAGNOSIS — Z113 Encounter for screening for infections with a predominantly sexual mode of transmission: Secondary | ICD-10-CM | POA: Insufficient documentation

## 2020-11-11 DIAGNOSIS — O0991 Supervision of high risk pregnancy, unspecified, first trimester: Secondary | ICD-10-CM | POA: Insufficient documentation

## 2020-11-11 DIAGNOSIS — Z8659 Personal history of other mental and behavioral disorders: Secondary | ICD-10-CM | POA: Insufficient documentation

## 2020-11-11 DIAGNOSIS — O09299 Supervision of pregnancy with other poor reproductive or obstetric history, unspecified trimester: Secondary | ICD-10-CM | POA: Insufficient documentation

## 2020-11-11 DIAGNOSIS — F32A Depression, unspecified: Secondary | ICD-10-CM | POA: Insufficient documentation

## 2020-11-11 LAB — HEPATITIS C ANTIBODY: HCV Ab: NEGATIVE

## 2020-11-11 NOTE — Progress Notes (Signed)
PhQ-8 GAD-4.  Pt does want genetic testing

## 2020-11-11 NOTE — Progress Notes (Signed)
Subjective:   Michelle Knight is a 20 y.o. G4P0020 at [redacted]w[redacted]d by LMP, c/w [place] today being seen today for her first obstetrical visit.  Her obstetrical history is significant for obesity and history of GDM and term IUFD and has IDA (iron deficiency anemia); Supervision of high risk pregnancy, antepartum; History of IUFD; History of gestational diabetes in prior pregnancy, currently pregnant; History of anxiety; Depression; and History of iron deficiency anemia on their problem list.. Patient does not intend to breast feed. Pregnancy history fully reviewed.  Patient reports no complaints.  HISTORY: OB History  Gravida Para Term Preterm AB Living  4 1 0 0 2 0  SAB IAB Ectopic Multiple Live Births  2 0 0 0 0    # Outcome Date GA Lbr Len/2nd Weight Sex Delivery Anes PTL Lv  4 Current           3 SAB           2 SAB           1 Para         FD   Past Medical History:  Diagnosis Date  . Anemia   . IUFD at 20 weeks or more of gestation    Past Surgical History:  Procedure Laterality Date  . WISDOM TOOTH EXTRACTION     Family History  Adopted: Yes   Social History   Tobacco Use  . Smoking status: Never Smoker  . Smokeless tobacco: Never Used  Vaping Use  . Vaping Use: Never used  Substance Use Topics  . Alcohol use: Not Currently  . Drug use: Not Currently    Types: Marijuana   Allergies  Allergen Reactions  . Other Itching, Swelling and Other (See Comments)    Certain exotic fruits...mango, dragonfruit, papaya,etc. Tongue swells.  . Mixed Grasses Itching  . Tramadol Hives   Current Outpatient Medications on File Prior to Visit  Medication Sig Dispense Refill  . escitalopram (LEXAPRO) 10 MG tablet Take by mouth.    . fluticasone (FLONASE) 50 MCG/ACT nasal spray     . Prenatal Vit-Fe Fumarate-FA (PRENATAL VITAMINS PO) Take by mouth.    Marland Kitchen VITAMIN D, CHOLECALCIFEROL, PO Take by mouth.    Marland Kitchen albuterol (VENTOLIN HFA) 108 (90 Base) MCG/ACT inhaler Inhale into the lungs.      No current facility-administered medications on file prior to visit.    Indications for ASA therapy (per uptodate) One of the following: Previous pregnancy with preeclampsia, especially early onset and with an adverse outcome No Multifetal gestation No Chronic hypertension No Type 1 or 2 diabetes mellitus No Chronic kidney disease No Autoimmune disease (antiphospholipid syndrome, systemic lupus erythematosus) No  Two or more of the following: Nulliparity No Obesity (body mass index >30 kg/m2) Yes Family history of preeclampsia in mother or sister No Age ?35 years No Sociodemographic characteristics (African American race, low socioeconomic level) No Personal risk factors (eg, previous pregnancy with low birth weight or small for gestational age infant, previous adverse pregnancy outcome [eg, stillbirth], interval >10 years between pregnancies) Yes  Indications for early 1 hour GTT (per uptodate)  BMI >25 (>23 in Asian women) AND one of the following  Gestational diabetes mellitus in a previous pregnancy Yes Glycated hemoglobin ?5.7 percent (39 mmol/mol), impaired glucose tolerance, or impaired fasting glucose on previous testing No First-degree relative with diabetes No High-risk race/ethnicity (eg, African American, Latino, Native American, [place] American, Pacific Islander) No History of cardiovascular disease No Hypertension or  on therapy for hypertension No High-density lipoprotein cholesterol level <35 mg/dL (1.32 mmol/L) and/or a triglyceride level >250 mg/dL (4.40 mmol/L) No Polycystic ovary syndrome No Physical inactivity No Other clinical condition associated with insulin resistance (eg, severe obesity, acanthosis nigricans) No Previous birth of an infant weighing ?4000 g No Previous stillbirth of unknown cause Yes Exam   Vitals:   11/11/20 0827  BP: 110/62  Pulse: 69  Weight: 185 lb (83.9 kg)   Fetal Heart Rate (bpm): 149  Uterus:     Pelvic Exam: Perineum:  deferred   Vulva: deferred   Vagina:  deferred   Cervix: deferred    Adnexa: deferred   Bony Pelvis: deferred  System: General: well-developed, well-nourished female in no acute distress   Breast:  deferred   Skin: normal coloration and turgor, no rashes   Neurologic: oriented, normal, negative, normal mood   Extremities: normal strength, tone, and muscle mass, ROM of all joints is normal   HEENT PERRLA, extraocular movement intact and sclera clear, anicteric   Mouth/Teeth mucous membranes moist, pharynx normal without lesions and dental hygiene good   Neck supple and no masses   Cardiovascular: regular rate and rhythm   Respiratory:  no respiratory distress, normal breath sounds   Abdomen: soft, non-tender; no masses,  no organomegaly     Assessment:   Pregnancy: [id number] Patient Active Problem List   Diagnosis Date Noted  . History of IUFD 11/11/2020  . History of gestational diabetes in prior pregnancy, currently pregnant 11/11/2020  . History of anxiety 11/11/2020  . Depression 11/11/2020  . History of iron deficiency anemia 11/11/2020  . Supervision of high risk pregnancy, antepartum 11/10/2020  . IDA (iron deficiency anemia) 04/30/2020     Plan:   1. Supervision of high risk pregnancy, antepartum - Patient was 15 minutes late to appointment so physical exam and history limited due to time constraints.   - No complaints. Routine care - Patient declines pap today. Reports she had a pap last year. Requested patient obtain records and bring/send copy to office so that we could file to her chart. - Plan to initiate bASA between 12-16 weeks given multiple risk factors for pre-eclampsia - Patient needs early GTT at next appointment given history of GDM and IUFD    - Obstetric panel - HIV antibody (with reflex) - Hepatitis C Antibody - Hemoglobinopathy Evaluation - Culture, OB Urine - Babyscripts Schedule Optimization - [place] bedside; Future - Urine cytology ancillary  only([facility]) - HgB A1c  2. History of IUFD - Term IUFD @ 38 weeks in 2018 - Plan MFM consult as well as serial growth [place] starting at 24 weeks, weekly BPP's starting at 32 weeks  3. [redacted] weeks gestation of pregnancy   4. History of gestational diabetes in prior pregnancy, currently pregnant - Early GTT at next appointment  5. History of anxiety - Currently on Lexapro - Mood stable - Continue to monitor   6. Depression, unspecified depression type   7. History of iron deficiency anemia - Receives iron infusion PRN as ordered by PCP  Initial labs drawn. Continue prenatal vitamins. Discussed and offered genetic screening options, including Quad screen/AFP, NIPS testing, and option to decline testing. Benefits/risks/alternatives reviewed. Pt aware that anatomy US is form of genetic screening with lower accuracy in detecting trisomies than blood work.  Pt chooses/declines genetic screening today. considering NIPS: undecided. Ultrasound discussed; fetal anatomic survey: requested. Problem list reviewed and updated. The nature of [facility]ulty [facility] with multiple MDs and other Advanced Practice Providers was explained to patient; also emphasized that residents, students are part of our team. Routine obstetric precautions reviewed. Return in about 4 weeks (around 12/09/2020) for ROB.     Brand Males, CNM 11/11/20 11:52 AM

## 2020-11-11 NOTE — Progress Notes (Signed)
Bedside U/S shows single IUP with FHT 149 BPM and CRL measures 17.1mm  GA [redacted]w[redacted]d

## 2020-11-12 LAB — URINE CYTOLOGY ANCILLARY ONLY
Chlamydia: NEGATIVE
Comment: NEGATIVE
Comment: NEGATIVE
Comment: NORMAL
Neisseria Gonorrhea: NEGATIVE
Trichomonas: NEGATIVE

## 2020-11-12 LAB — HIV ANTIBODY (ROUTINE TESTING W REFLEX): HIV 1&2 Ab, 4th Generation: NONREACTIVE

## 2020-11-13 LAB — HEMOGLOBINOPATHY EVALUATION
Fetal Hemoglobin Testing: <1 % (ref 0.0–1.9)
HCT: 45.1 % — ABNORMAL HIGH (ref 35.0–45.0)
Hemoglobin A2 - HGBRFX: 2.8 % (ref 2.2–3.2)
Hemoglobin: 14 g/dL (ref 11.7–15.5)
Hgb A: 97.2 % (ref >96.0)
MCH: 27.9 pg (ref 27.0–33.0)
MCV: 90 fL (ref 80.0–100.0)
RBC: 5.01 10*6/uL (ref 3.80–5.10)
RDW: 13 % (ref 11.0–15.0)

## 2020-11-13 LAB — OBSTETRIC PANEL
Absolute Monocytes: 449 cells/uL (ref 200–950)
Antibody Screen: NOT DETECTED
Basophils Absolute: 53 cells/uL (ref 0–200)
Basophils Relative: 0.6 %
Eosinophils Absolute: 79 cells/uL (ref 15–500)
Eosinophils Relative: 0.9 %
HCT: 42.2 % (ref 35.0–45.0)
Hemoglobin: 14.2 g/dL (ref 11.7–15.5)
Hepatitis B Surface Ag: NONREACTIVE
Lymphs Abs: 2033 cells/uL (ref 850–3900)
MCH: 29 pg (ref 27.0–33.0)
MCHC: 33.6 g/dL (ref 32.0–36.0)
MCV: 86.1 fL (ref 80.0–100.0)
MPV: 10.8 fL (ref 7.5–12.5)
Monocytes Relative: 5.1 %
Neutro Abs: 6186 cells/uL (ref 1500–7800)
Neutrophils Relative %: 70.3 %
Platelets: 284 10*3/uL (ref 140–400)
RBC: 4.9 10*6/uL (ref 3.80–5.10)
RDW: 12.8 % (ref 11.0–15.0)
RPR Ser Ql: NONREACTIVE
Rubella: 8.69 Index
Total Lymphocyte: 23.1 %
WBC: 8.8 10*3/uL (ref 3.8–10.8)

## 2020-11-13 LAB — HEMOGLOBIN A1C
Hgb A1c MFr Bld: 5.3 % of total Hgb (ref <5.7)
Mean Plasma Glucose: 105 mg/dL
eAG (mmol/L): 5.8 mmol/L

## 2020-11-13 LAB — CULTURE, OB URINE

## 2020-11-13 LAB — URINE CULTURE, OB REFLEX

## 2020-11-13 LAB — HEPATITIS C ANTIBODY
Hepatitis C Ab: NONREACTIVE
SIGNAL TO CUT-OFF: 0 (ref <1.00)

## 2020-11-17 ENCOUNTER — Other Ambulatory Visit: Payer: Self-pay

## 2020-11-17 ENCOUNTER — Emergency Department (HOSPITAL_BASED_OUTPATIENT_CLINIC_OR_DEPARTMENT_OTHER)
Admission: EM | Admit: 2020-11-17 | Discharge: 2020-11-18 | Disposition: A | Discharge status: Home / Self Care | Payer: BC Managed Care – PPO | Attending: Emergency Medicine | Admitting: Emergency Medicine

## 2020-11-17 ENCOUNTER — Encounter (HOSPITAL_BASED_OUTPATIENT_CLINIC_OR_DEPARTMENT_OTHER): Payer: Self-pay | Admitting: *Deleted

## 2020-11-17 DIAGNOSIS — B349 Viral infection, unspecified: Secondary | ICD-10-CM | POA: Diagnosis not present

## 2020-11-17 DIAGNOSIS — Z20822 Contact with and (suspected) exposure to covid-19: Secondary | ICD-10-CM | POA: Insufficient documentation

## 2020-11-17 DIAGNOSIS — Z3A09 9 weeks gestation of pregnancy: Secondary | ICD-10-CM | POA: Insufficient documentation

## 2020-11-17 DIAGNOSIS — O98511 Other viral diseases complicating pregnancy, first trimester: Secondary | ICD-10-CM | POA: Insufficient documentation

## 2020-11-17 DIAGNOSIS — Z28310 Unvaccinated for covid-19: Secondary | ICD-10-CM | POA: Diagnosis not present

## 2020-11-17 DIAGNOSIS — O26891 Other specified pregnancy related conditions, first trimester: Secondary | ICD-10-CM | POA: Diagnosis present

## 2020-11-17 NOTE — ED Triage Notes (Signed)
Cough x 2 weeks ago. Her Covid and flu test were negative. She was treated with Amoxicillin. She is [redacted] weeks pregnant and is limited in the OTC cold medications she can take.

## 2020-11-18 LAB — RESP PANEL BY RT-PCR (FLU A&B, COVID) ARPGX2
Influenza A by PCR: NEGATIVE
Influenza B by PCR: NEGATIVE
SARS Coronavirus 2 by RT PCR: NEGATIVE

## 2020-11-18 NOTE — ED Provider Notes (Signed)
[facility] EMERGENCY DEPARTMENT Provider Note   CSN: [id number] Arrival date & time: 11/17/20  2134     History Chief Complaint  Patient presents with  . Shortness of Breath    Michelle Knight is a 20 y.o. female.  HPI     This is a 20 year old female who presents with persistent cough and sore throat.  Patient reports over the last 2 weeks she has had upper respiratory symptoms.  She was treated for bronchitis with amoxicillin and finished her course.  She reports negative COVID, influenza, and strep testing on May 9.  She states her last several days she has had increasing sore throat and pain with swallowing.  She reports ongoing shortness of breath and cough.  She has used her inhaler as needed.  No known sick contacts.  She is not vaccinated against COVID-19.  She denies any vaginal bleeding or abdominal pain.  She reports that she is approximately [redacted] weeks pregnant.  No ongoing fevers.  Past Medical History:  Diagnosis Date  . Anemia   . IUFD at 20 weeks or more of gestation     Patient Active Problem List   Diagnosis Date Noted  . History of IUFD 11/11/2020  . History of gestational diabetes in prior pregnancy, currently pregnant 11/11/2020  . History of anxiety 11/11/2020  . Depression 11/11/2020  . History of iron deficiency anemia 11/11/2020  . Supervision of high risk pregnancy, antepartum 11/10/2020  . IDA (iron deficiency anemia) 04/30/2020    Past Surgical History:  Procedure Laterality Date  . WISDOM TOOTH EXTRACTION       OB History    Gravida  4   Para  1   Term      Preterm      AB  2   Living  0     SAB  2   IAB      Ectopic      Multiple      Live Births  0           Family History  Adopted: Yes    Social History   Tobacco Use  . Smoking status: Never Smoker  . Smokeless tobacco: Never Used  Vaping Use  . Vaping Use: Never used  Substance Use Topics  . Alcohol use: Not Currently  . Drug use: Not  Currently    Types: Marijuana    Home Medications Prior to Admission medications   Medication Sig Start Date End Date Taking? Authorizing Provider  albuterol (VENTOLIN HFA) 108 (90 Base) MCG/ACT inhaler Inhale into the lungs. 09/24/19 09/29/20  [provider]  escitalopram (LEXAPRO) 10 MG tablet Take by mouth. 08/20/19   [provider]  fluticasone (FLONASE) 50 MCG/ACT nasal spray     [provider]  Prenatal Vit-Fe Fumarate-FA (PRENATAL VITAMINS PO) Take by mouth.    [provider]  VITAMIN D, CHOLECALCIFEROL, PO Take by mouth.    [provider]    Allergies    Other, Mixed grasses, and Tramadol  Review of Systems   Review of Systems  Constitutional: Negative for fever.  HENT: Positive for congestion and sore throat.   Respiratory: Positive for cough and shortness of breath.   Cardiovascular: Negative for chest pain.  Gastrointestinal: Negative for abdominal pain and nausea.  Genitourinary: Negative for dysuria.  All other systems reviewed and are negative.   Physical Exam Updated Vital Signs BP 115/75 (BP Location: Right Arm)   Pulse (!) 57  Temp 98.1 F (36.7 C) (Oral)   Resp 18   Ht 1.448 m (4\' 9" )   Wt 83 kg   LMP 09/13/2020   SpO2 100%   BMI 39.60 kg/m   Physical Exam Vitals and nursing note reviewed.  Constitutional:      Appearance: She is well-developed. She is obese. She is not ill-appearing.  HENT:     Head: Normocephalic and atraumatic.     Mouth/Throat:     Comments: No posterior oropharyngeal erythema or exudate, uvula midline, no tonsillar swelling noted Eyes:     Pupils: Pupils are equal, round, and reactive to light.  Cardiovascular:     Rate and Rhythm: Normal rate and regular rhythm.     Heart sounds: Normal heart sounds.  Pulmonary:     Effort: Pulmonary effort is normal. No respiratory distress.     Breath sounds: No wheezing.  Abdominal:     General: Bowel sounds are normal.      Palpations: Abdomen is soft.  Musculoskeletal:     Cervical back: Neck supple.     Right lower leg: No tenderness. No edema.     Left lower leg: No tenderness. No edema.  Skin:    General: Skin is warm and dry.  Neurological:     Mental Status: She is alert and oriented to person, place, and time.  Psychiatric:        Mood and Affect: Mood normal.     ED Results / Procedures / Treatments   Labs (all labs ordered are listed, but only abnormal results are displayed) Labs Reviewed  RESP PANEL BY RT-PCR (FLU A&B, COVID) [id number]    EKG None  Radiology No results found.  Procedures Procedures   Medications Ordered in ED Medications - No data to display  ED Course  I have reviewed the triage vital signs and the nursing notes.  Pertinent labs & imaging results that were available during my care of the patient were reviewed by me and considered in my medical decision making (see chart for details).    MDM Rules/Calculators/A&P                          Patient presents with ongoing upper respiratory symptoms including cough and sore throat.  She is overall nontoxic.  She finished a course of antibiotics.  She is afebrile here.  Her oropharyngeal exam is fairly benign.  Additionally her pulmonary exam is without significant wheezing or rhonchi.  She would have been appropriately treated for both strep pharyngitis and pneumonia.  Highly suspect viral etiology.  Will repeat COVID and influenza testing given her pregnancy.  I discussed with her that most likely this was viral in nature.  She does have limited interventions because of her pregnancy.  Tylenol for pain.  She can take honey for cough and continue her inhaler.  COVID and influenza testing are negative.  After history, exam, and medical workup I feel the patient has been appropriately medically screened and is safe for discharge home. Pertinent diagnoses were discussed with the patient. Patient was given return  precautions.  Michelle Knight was evaluated in Emergency Department on 11/18/2020 for the symptoms described in the history of present illness. She was evaluated in the context of the global COVID-19 pandemic, which necessitated consideration that the patient might be at risk for infection with the SARS-CoV-2 virus that causes COVID-19. Institutional protocols and algorithms that pertain to the evaluation of patients at  risk for COVID-19 are in a state of rapid change based on information released by regulatory bodies including the CDC and federal and state organizations. These policies and algorithms were followed during the patient's care in the ED.  Final Clinical Impression(s) / ED Diagnoses Final diagnoses:  Viral illness    Rx / DC Orders ED Discharge Orders    None       Reise Hietala, Mayer Masker, MD 11/18/20 [phone]

## 2020-11-18 NOTE — Discharge Instructions (Addendum)
You were seen today for ongoing cough and sore throat.  Your COVID and influenza testing continue to be negative.  You likely have a virus.  Given your pregnancy, you are limited on over-the-counter medications.  You can take honey for your cough.  Additionally, take Tylenol for any pain control.

## 2020-12-09 ENCOUNTER — Other Ambulatory Visit (HOSPITAL_COMMUNITY)
Admission: RE | Admit: 2020-12-09 | Discharge: 2020-12-09 | Disposition: A | Discharge status: Home / Self Care | Payer: BC Managed Care – PPO | Source: Ambulatory Visit | Attending: Obstetrics and Gynecology | Admitting: Obstetrics and Gynecology

## 2020-12-09 ENCOUNTER — Ambulatory Visit (INDEPENDENT_AMBULATORY_CARE_PROVIDER_SITE_OTHER): Payer: BC Managed Care – PPO | Admitting: Obstetrics and Gynecology

## 2020-12-09 ENCOUNTER — Other Ambulatory Visit: Payer: Self-pay

## 2020-12-09 VITALS — BP 104/68 | HR 69 | Wt 174.0 lb

## 2020-12-09 DIAGNOSIS — Z113 Encounter for screening for infections with a predominantly sexual mode of transmission: Secondary | ICD-10-CM | POA: Insufficient documentation

## 2020-12-09 DIAGNOSIS — Z3A12 12 weeks gestation of pregnancy: Secondary | ICD-10-CM

## 2020-12-09 DIAGNOSIS — Z8759 Personal history of other complications of pregnancy, childbirth and the puerperium: Secondary | ICD-10-CM | POA: Diagnosis not present

## 2020-12-09 DIAGNOSIS — D509 Iron deficiency anemia, unspecified: Secondary | ICD-10-CM | POA: Diagnosis not present

## 2020-12-09 DIAGNOSIS — O0991 Supervision of high risk pregnancy, unspecified, first trimester: Secondary | ICD-10-CM | POA: Diagnosis present

## 2020-12-09 DIAGNOSIS — O99011 Anemia complicating pregnancy, first trimester: Secondary | ICD-10-CM | POA: Diagnosis not present

## 2020-12-09 DIAGNOSIS — O0990 Supervision of high risk pregnancy, unspecified, unspecified trimester: Secondary | ICD-10-CM

## 2020-12-09 NOTE — Patient Instructions (Signed)

## 2020-12-09 NOTE — Progress Notes (Signed)
   PRENATAL VISIT NOTE  Subjective:  Michelle Knight is a 20 y.o. G4P0020 at [redacted]w[redacted]d being seen today for ongoing prenatal care.  She is currently monitored for the following issues for this high-risk pregnancy and has IDA (iron deficiency anemia); Supervision of high risk pregnancy, antepartum; History of IUFD; History of gestational diabetes in prior pregnancy, currently pregnant; History of anxiety; Depression; and History of iron deficiency anemia on their problem list.  Patient reports sciatic nerve pain; bilateral .   . Vag. Bleeding: None.   . Denies leaking of fluid.   The following portions of the patient's history were reviewed and updated as appropriate: allergies, current medications, past family history, past medical history, past social history, past surgical history and problem list.   Objective:   Vitals:   12/09/20 1038  BP: 104/68  Pulse: 69  Weight: 174 lb (78.9 kg)    Fetal Status: Fetal Heart Rate (bpm): 151         General:  Alert, oriented and cooperative. Patient is in no acute distress.  Skin: Skin is warm and dry. No rash noted.   Cardiovascular: Normal heart rate noted  Respiratory: Normal respiratory effort, no problems with respiration noted  Abdomen: Soft, gravid, appropriate for gestational age.        Pelvic: Cervical exam deferred        Extremities: Normal range of motion.  Edema: None  Mental Status: Normal mood and affect. Normal behavior. Normal judgment and thought content.   Assessment and Plan:  Pregnancy: G4P0020 at [redacted]w[redacted]d 1. [redacted] weeks gestation of pregnancy   2. Supervision of high risk pregnancy, antepartum  Start baby asa today.  Genetic screening done today.  MFM [place] odered Pap collected today.  Birth control options printer for her to review.  She is planning on 2 hour GTT at visit on 7/5- she is planning to come to this visit fasting.   Preterm labor symptoms and general obstetric precautions including but not limited to vaginal  bleeding, contractions, leaking of fluid and fetal movement were reviewed in detail with the patient. Please refer to After Visit Summary for other counseling recommendations.   No follow-ups on file.  Future Appointments  Date Time Provider Department Center  12/30/2020 11:15 AM [facility] None  12/30/2020 11:45 AM [place], Brand Males, NP [facility] None  01/06/2021 10:10 AM [name], CNM [id number]    Venia Carbon, NP

## 2020-12-10 LAB — CYTOLOGY - PAP
Adequacy: ABSENT
Chlamydia: NEGATIVE
Comment: NEGATIVE
Comment: NEGATIVE
Comment: NORMAL
Diagnosis: NEGATIVE
Neisseria Gonorrhea: NEGATIVE
Trichomonas: NEGATIVE

## 2020-12-18 ENCOUNTER — Encounter: Payer: Self-pay | Admitting: *Deleted

## 2020-12-18 DIAGNOSIS — O0990 Supervision of high risk pregnancy, unspecified, unspecified trimester: Secondary | ICD-10-CM

## 2020-12-30 ENCOUNTER — Encounter: Payer: Self-pay | Admitting: Family

## 2020-12-30 ENCOUNTER — Inpatient Hospital Stay (HOSPITAL_BASED_OUTPATIENT_CLINIC_OR_DEPARTMENT_OTHER): Payer: BC Managed Care – PPO | Admitting: Family

## 2020-12-30 ENCOUNTER — Other Ambulatory Visit: Payer: Self-pay

## 2020-12-30 ENCOUNTER — Telehealth: Payer: Self-pay

## 2020-12-30 ENCOUNTER — Inpatient Hospital Stay: Payer: BC Managed Care – PPO | Attending: Hematology & Oncology

## 2020-12-30 VITALS — BP 107/70 | HR 82 | Temp 98.8°F | Resp 16 | Ht <= 58 in | Wt 172.0 lb

## 2020-12-30 DIAGNOSIS — O26892 Other specified pregnancy related conditions, second trimester: Secondary | ICD-10-CM | POA: Diagnosis not present

## 2020-12-30 DIAGNOSIS — Z79899 Other long term (current) drug therapy: Secondary | ICD-10-CM | POA: Insufficient documentation

## 2020-12-30 DIAGNOSIS — Z3A00 Weeks of gestation of pregnancy not specified: Secondary | ICD-10-CM | POA: Insufficient documentation

## 2020-12-30 DIAGNOSIS — R5383 Other fatigue: Secondary | ICD-10-CM | POA: Insufficient documentation

## 2020-12-30 DIAGNOSIS — Z885 Allergy status to narcotic agent status: Secondary | ICD-10-CM | POA: Diagnosis not present

## 2020-12-30 DIAGNOSIS — D509 Iron deficiency anemia, unspecified: Secondary | ICD-10-CM | POA: Diagnosis not present

## 2020-12-30 DIAGNOSIS — Z888 Allergy status to other drugs, medicaments and biological substances status: Secondary | ICD-10-CM | POA: Diagnosis not present

## 2020-12-30 DIAGNOSIS — R202 Paresthesia of skin: Secondary | ICD-10-CM | POA: Diagnosis not present

## 2020-12-30 DIAGNOSIS — R110 Nausea: Secondary | ICD-10-CM | POA: Insufficient documentation

## 2020-12-30 DIAGNOSIS — R519 Headache, unspecified: Secondary | ICD-10-CM | POA: Insufficient documentation

## 2020-12-30 DIAGNOSIS — D500 Iron deficiency anemia secondary to blood loss (chronic): Secondary | ICD-10-CM

## 2020-12-30 DIAGNOSIS — O99012 Anemia complicating pregnancy, second trimester: Secondary | ICD-10-CM | POA: Diagnosis present

## 2020-12-30 LAB — RETICULOCYTES
Immature Retic Fract: 14.4 % (ref 2.3–15.9)
RBC.: 4.63 MIL/uL (ref 3.87–5.11)
Retic Count, Absolute: 88.4 10*3/uL (ref 19.0–186.0)
Retic Ct Pct: 1.9 % (ref 0.4–3.1)

## 2020-12-30 LAB — CBC WITH DIFFERENTIAL (CANCER CENTER ONLY)
Abs Immature Granulocytes: 0.14 10*3/uL — ABNORMAL HIGH (ref 0.00–0.07)
Basophils Absolute: 0 10*3/uL (ref 0.0–0.1)
Basophils Relative: 0 %
Eosinophils Absolute: 0.1 10*3/uL (ref 0.0–0.5)
Eosinophils Relative: 1 %
HCT: 38.9 % (ref 36.0–46.0)
Hemoglobin: 13 g/dL (ref 12.0–15.0)
Immature Granulocytes: 1 %
Lymphocytes Relative: 19 %
Lymphs Abs: 2.1 10*3/uL (ref 0.7–4.0)
MCH: 28.3 pg (ref 26.0–34.0)
MCHC: 33.4 g/dL (ref 30.0–36.0)
MCV: 84.7 fL (ref 80.0–100.0)
Monocytes Absolute: 0.6 10*3/uL (ref 0.1–1.0)
Monocytes Relative: 5 %
Neutro Abs: 8.6 10*3/uL — ABNORMAL HIGH (ref 1.7–7.7)
Neutrophils Relative %: 74 %
Platelet Count: 245 10*3/uL (ref 150–400)
RBC: 4.59 MIL/uL (ref 3.87–5.11)
RDW: 12.8 % (ref 11.5–15.5)
WBC Count: 11.5 10*3/uL — ABNORMAL HIGH (ref 4.0–10.5)
nRBC: 0 % (ref 0.0–0.2)

## 2020-12-30 LAB — IRON AND TIBC
Iron: 75 ug/dL (ref 28–170)
Saturation Ratios: 18 % (ref 10.4–31.8)
TIBC: 423 ug/dL (ref 250–450)
UIBC: 348 ug/dL

## 2020-12-30 LAB — FERRITIN: Ferritin: 58 ng/mL (ref 11–307)

## 2020-12-30 NOTE — Progress Notes (Signed)
Hematology and Oncology Follow Up Visit  Michelle Knight [id number] [birth year] 20 y.o. 12/30/2020   Principle Diagnosis:  Iron deficiency anemia  Pregnant due 06/20/2021    Current Therapy:        IV iron as indicated    Interim History:  Michelle Knight is here today for follow-up. She has just entered her second trimester and is pregnant with a sweet baby boy. Her due date is 06/20/2021.  She has not noted any blood loss. No bruising or petechiae.  She notes fatigue, headaches and occasional nausea. The nausea seems to be improving.  No fever, chills, vomiting, cough, rash, dizziness, SOB, chest pain, palpitations, abdominal pain or changes in bowel or bladder habits.  No swelling, tenderness in her extremities. She has intermittent tingling in her fingers.  No falls or syncope.  She has maintained a good appetite and is staying well hydrated. Her weight is stable at 172 lbs.   ECOG Performance Status: 1 - Symptomatic but completely ambulatory  Medications:  Allergies as of 12/30/2020       Reactions   Other Itching, Swelling, Other (See Comments)   Certain exotic fruits...mango, dragonfruit, papaya,etc. Tongue swells.   Mixed Grasses Itching   Tramadol Hives        Medication List        Accurate as of December 30, 2020 12:13 PM. If you have any questions, ask your nurse or doctor.          STOP taking these medications    albuterol 108 (90 Base) MCG/ACT inhaler Commonly known as: VENTOLIN HFA Stopped by: Emeline Gins, NP   ergocalciferol 1.25 MG (50000 UT) capsule Commonly known as: VITAMIN D2 Stopped by: Emeline Gins, NP   escitalopram 10 MG tablet Commonly known as: LEXAPRO Stopped by: Emeline Gins, NP   fluticasone 50 MCG/ACT nasal spray Commonly known as: FLONASE Stopped by: Emeline Gins, NP   VITAMIN D (CHOLECALCIFEROL) PO Stopped by: Emeline Gins, NP       TAKE these medications    aspirin EC 81 MG tablet Take 81 mg by  mouth daily. Swallow whole.   PRENATAL VITAMINS PO Take by mouth.        Allergies:  Allergies  Allergen Reactions   Other Itching, Swelling and Other (See Comments)    Certain exotic fruits...mango, dragonfruit, papaya,etc. Tongue swells.   Mixed Grasses Itching   Tramadol Hives    Past Medical History, Surgical history, Social history, and Family History were reviewed and updated.  Review of Systems: All other 10 point review of systems is negative.   Physical Exam:  height is 4\' 9"  (1.448 m) and weight is 172 lb (78 kg). Her oral temperature is 98.8 F (37.1 C). Her blood pressure is 107/70 and her pulse is 82. Her respiration is 16 and oxygen saturation is 100%.   Wt Readings from Last 3 Encounters:  12/30/20 172 lb (78 kg)  12/09/20 174 lb (78.9 kg)  11/17/20 183 lb (83 kg)    Ocular: Sclerae unicteric, pupils equal, round and reactive to light Ear-nose-throat: Oropharynx clear, dentition fair Lymphatic: No cervical or supraclavicular adenopathy Lungs no rales or rhonchi, good excursion bilaterally Heart regular rate and rhythm, no murmur appreciated Abd soft, nontender, positive bowel sounds MSK no focal spinal tenderness, no joint edema Neuro: non-focal, well-oriented, appropriate affect Breasts: Deferred   Lab Results  Component Value Date   WBC 11.5 (H) 12/30/2020   HGB 13.0 12/30/2020   HCT 38.9 12/30/2020  MCV 84.7 12/30/2020   PLT 245 12/30/2020   Lab Results  Component Value Date   FERRITIN 81 09/29/2020   IRON 122 09/29/2020   TIBC 389 09/29/2020   UIBC 267 09/29/2020   IRONPCTSAT 31 09/29/2020   Lab Results  Component Value Date   RETICCTPCT 1.9 12/30/2020   RBC 4.59 12/30/2020   RBC 4.63 12/30/2020   No results found for: KPAFRELGTCHN, LAMBDASER, KAPLAMBRATIO No results found for: IGGSERUM, IGA, IGMSERUM No results found for: Marda Stalker, SPEI   Chemistry      Component Value  Date/Time   NA 138 04/29/2020 1342   K 3.8 04/29/2020 1342   CL 104 04/29/2020 1342   CO2 26 04/29/2020 1342   BUN 9 04/29/2020 1342   CREATININE 0.61 04/29/2020 1342      Component Value Date/Time   CALCIUM 9.5 04/29/2020 1342   ALKPHOS 89 04/29/2020 1342   AST 14 (L) 04/29/2020 1342   ALT 12 04/29/2020 1342   BILITOT 0.3 04/29/2020 1342       Impression and Plan: Michelle Knight is a very pleasant 20 yo female with iron deficiency anemia.  Iron studies are pending.  Follow-up in 3 months.  She can contact our office with any questions or concerns.   Emeline Gins, NP 6/28/202212:13 PM

## 2021-01-06 ENCOUNTER — Ambulatory Visit (INDEPENDENT_AMBULATORY_CARE_PROVIDER_SITE_OTHER): Payer: BC Managed Care – PPO | Admitting: Nurse Practitioner

## 2021-01-06 ENCOUNTER — Encounter: Payer: Self-pay | Admitting: Nurse Practitioner

## 2021-01-06 ENCOUNTER — Other Ambulatory Visit: Payer: Self-pay

## 2021-01-06 VITALS — BP 111/60 | HR 79 | Wt 169.0 lb

## 2021-01-06 DIAGNOSIS — Z8632 Personal history of gestational diabetes: Secondary | ICD-10-CM

## 2021-01-06 DIAGNOSIS — Z3A16 16 weeks gestation of pregnancy: Secondary | ICD-10-CM

## 2021-01-06 DIAGNOSIS — O09299 Supervision of pregnancy with other poor reproductive or obstetric history, unspecified trimester: Secondary | ICD-10-CM

## 2021-01-06 DIAGNOSIS — Z3480 Encounter for supervision of other normal pregnancy, unspecified trimester: Secondary | ICD-10-CM

## 2021-01-06 NOTE — Progress Notes (Signed)
    Subjective:  Michelle Knight is a 20 y.o. G4P0020 at [redacted]w[redacted]d being seen today for ongoing prenatal care.  She is currently monitored for the following issues for this high-risk pregnancy and has IDA (iron deficiency anemia); Supervision of high risk pregnancy, antepartum; History of IUFD; History of gestational diabetes in prior pregnancy, currently pregnant; History of anxiety; Depression; and History of iron deficiency anemia on their problem list.  Patient reports  near syncopal episode yesterday .   . Vag. Bleeding: None.  Movement: Absent. Denies leaking of fluid.   The following portions of the patient's history were reviewed and updated as appropriate: allergies, current medications, past family history, past medical history, past social history, past surgical history and problem list. Problem list updated.  Objective:   Vitals:   01/06/21 1021  BP: 111/60  Pulse: 79  Weight: 169 lb (76.7 kg)    Fetal Status: Fetal Heart Rate (bpm): 147   Movement: Absent     General:  Alert, oriented and cooperative. Patient is in no acute distress.  Skin: Skin is warm and dry. No rash noted.   Cardiovascular: Normal heart rate noted  Respiratory: Normal respiratory effort, no problems with respiration noted  Abdomen: Soft, gravid, appropriate for gestational age. Pain/Pressure: Absent     Pelvic:  Cervical exam deferred        Extremities: Normal range of motion.  Edema: None  Mental Status: Normal mood and affect. Normal behavior. Normal judgment and thought content.   Urinalysis:      Assessment and Plan:  Pregnancy: G4P0020 at [redacted]w[redacted]d  1. Supervision of other normal pregnancy, antepartum Doing well.  Nausea improving.  Went to the ball game last night and in the heat, got hot and had a near syncopal episode.  Did not lose consciousness.  Was aware the her "vision got darker" and took precautions to sit down.  Medical team at the game assisted her back to her seat when she was feeling  better. Advised if these keep happening to be sure to call and have another visit to address further Has anatomy US scheduled  - Glucose Tolerance, 1 HR (50g) - Alpha fetoprotein, maternal  2. History of gestational diabetes in prior pregnancy, currently pregnant   3. [redacted] weeks gestation of pregnancy   Preterm labor symptoms and general obstetric precautions including but not limited to vaginal bleeding, contractions, leaking of fluid and fetal movement were reviewed in detail with the patient. Please refer to After Visit Summary for other counseling recommendations.  Return in about 4 weeks (around 02/03/2021) for in person ROB.  Nolene Bernheim, RN, MSN, NP-BC Nurse Practitioner, [facility] for [employer], [facility] 01/06/2021 10:39 AM

## 2021-01-07 ENCOUNTER — Telehealth: Payer: Self-pay | Admitting: *Deleted

## 2021-01-07 NOTE — Telephone Encounter (Signed)
Error

## 2021-01-08 ENCOUNTER — Encounter: Payer: Self-pay | Admitting: Nurse Practitioner

## 2021-01-08 DIAGNOSIS — O24419 Gestational diabetes mellitus in pregnancy, unspecified control: Secondary | ICD-10-CM | POA: Insufficient documentation

## 2021-01-15 ENCOUNTER — Other Ambulatory Visit (INDEPENDENT_AMBULATORY_CARE_PROVIDER_SITE_OTHER): Payer: BC Managed Care – PPO | Admitting: *Deleted

## 2021-01-15 ENCOUNTER — Other Ambulatory Visit: Payer: Self-pay

## 2021-01-15 DIAGNOSIS — R7309 Other abnormal glucose: Secondary | ICD-10-CM

## 2021-01-15 NOTE — Progress Notes (Signed)
Pt her for 2 hr GTT due to abnormal 1 hr.

## 2021-01-16 LAB — GLUCOSE TOLERANCE, 1 HOUR (50G) W/O FASTING: Glucose, 1 Hr, gestational: 143 mg/dL — ABNORMAL HIGH (ref 65–139)

## 2021-01-16 LAB — TIQ-MISC

## 2021-01-16 LAB — ALPHA FETOPROTEIN, MATERNAL

## 2021-01-17 LAB — GLUCOSE TOLERANCE, 2 HOURS W/ 1HR
Glucose, 1 hour: 78 mg/dL (ref 65–199)
Glucose, 2 hour: 89 mg/dL (ref 65–139)
Glucose, Fasting: 72 mg/dL (ref 65–99)

## 2021-01-23 LAB — ALPHA FETOPROTEIN, MATERNAL
AFP MoM: 0.91
AFP, Serum: 27.9 ng/mL
Calc'd Gestational Age: 16.4 weeks
Maternal Wt: 169 [lb_av]
Risk for ONTD: <1
Twins-AFP: 1

## 2021-01-28 ENCOUNTER — Ambulatory Visit: Payer: BC Managed Care – PPO

## 2021-01-28 ENCOUNTER — Encounter: Payer: Self-pay | Admitting: *Deleted

## 2021-01-28 ENCOUNTER — Other Ambulatory Visit: Payer: BC Managed Care – PPO

## 2021-01-28 ENCOUNTER — Other Ambulatory Visit: Payer: Self-pay

## 2021-01-28 ENCOUNTER — Other Ambulatory Visit: Payer: Self-pay | Admitting: Obstetrics and Gynecology

## 2021-01-28 ENCOUNTER — Ambulatory Visit: Payer: BC Managed Care – PPO | Attending: Obstetrics and Gynecology

## 2021-01-28 ENCOUNTER — Ambulatory Visit: Payer: BC Managed Care – PPO | Admitting: *Deleted

## 2021-01-28 VITALS — BP 121/60 | HR 87

## 2021-01-28 DIAGNOSIS — Z3A19 19 weeks gestation of pregnancy: Secondary | ICD-10-CM

## 2021-01-28 DIAGNOSIS — O09299 Supervision of pregnancy with other poor reproductive or obstetric history, unspecified trimester: Secondary | ICD-10-CM

## 2021-01-28 DIAGNOSIS — O99012 Anemia complicating pregnancy, second trimester: Secondary | ICD-10-CM | POA: Insufficient documentation

## 2021-01-28 DIAGNOSIS — Z3689 Encounter for other specified antenatal screening: Secondary | ICD-10-CM

## 2021-01-28 DIAGNOSIS — Z8632 Personal history of gestational diabetes: Secondary | ICD-10-CM | POA: Insufficient documentation

## 2021-01-28 DIAGNOSIS — O09292 Supervision of pregnancy with other poor reproductive or obstetric history, second trimester: Secondary | ICD-10-CM

## 2021-01-28 DIAGNOSIS — Z3A12 12 weeks gestation of pregnancy: Secondary | ICD-10-CM

## 2021-01-28 DIAGNOSIS — O99212 Obesity complicating pregnancy, second trimester: Secondary | ICD-10-CM

## 2021-01-28 DIAGNOSIS — O0990 Supervision of high risk pregnancy, unspecified, unspecified trimester: Secondary | ICD-10-CM | POA: Diagnosis present

## 2021-01-28 DIAGNOSIS — Z363 Encounter for antenatal screening for malformations: Secondary | ICD-10-CM | POA: Diagnosis present

## 2021-01-29 ENCOUNTER — Other Ambulatory Visit: Payer: Self-pay | Admitting: *Deleted

## 2021-01-29 DIAGNOSIS — O09299 Supervision of pregnancy with other poor reproductive or obstetric history, unspecified trimester: Secondary | ICD-10-CM

## 2021-02-05 ENCOUNTER — Other Ambulatory Visit: Payer: Self-pay

## 2021-02-05 ENCOUNTER — Ambulatory Visit (INDEPENDENT_AMBULATORY_CARE_PROVIDER_SITE_OTHER): Payer: BC Managed Care – PPO | Admitting: Obstetrics and Gynecology

## 2021-02-05 ENCOUNTER — Encounter: Payer: Self-pay | Admitting: Obstetrics and Gynecology

## 2021-02-05 VITALS — BP 110/73 | HR 93 | Wt 167.0 lb

## 2021-02-05 DIAGNOSIS — Z3480 Encounter for supervision of other normal pregnancy, unspecified trimester: Secondary | ICD-10-CM

## 2021-02-05 DIAGNOSIS — Z8759 Personal history of other complications of pregnancy, childbirth and the puerperium: Secondary | ICD-10-CM

## 2021-02-05 DIAGNOSIS — O0990 Supervision of high risk pregnancy, unspecified, unspecified trimester: Secondary | ICD-10-CM

## 2021-02-05 DIAGNOSIS — Z8632 Personal history of gestational diabetes: Secondary | ICD-10-CM

## 2021-02-05 DIAGNOSIS — O09299 Supervision of pregnancy with other poor reproductive or obstetric history, unspecified trimester: Secondary | ICD-10-CM

## 2021-02-05 DIAGNOSIS — O99210 Obesity complicating pregnancy, unspecified trimester: Secondary | ICD-10-CM | POA: Insufficient documentation

## 2021-02-05 NOTE — Progress Notes (Signed)
   PRENATAL VISIT NOTE  Subjective:  Michelle Knight is a 20 y.o. G4P0020 at [redacted]w[redacted]d being seen today for ongoing prenatal care.  She is currently monitored for the following issues for this high-risk pregnancy and has IDA (iron deficiency anemia); Supervision of high risk pregnancy, antepartum; History of IUFD; History of gestational diabetes in prior pregnancy, currently pregnant; History of anxiety; Depression; History of iron deficiency anemia; Gestational diabetes; and Maternal obesity affecting pregnancy, antepartum on their problem list.  Patient reports no complaints.  Contractions: Not present. Vag. Bleeding: None.  Movement: Present. Denies leaking of fluid.   The following portions of the patient's history were reviewed and updated as appropriate: allergies, current medications, past family history, past medical history, past social history, past surgical history and problem list.   Objective:   Vitals:   02/05/21 0957  BP: 110/73  Pulse: 93  Weight: 167 lb (75.8 kg)    Fetal Status: Fetal Heart Rate (bpm): 147 Fundal Height: 21 cm Movement: Present     General:  Alert, oriented and cooperative. Patient is in no acute distress.  Skin: Skin is warm and dry. No rash noted.   Cardiovascular: Normal heart rate noted  Respiratory: Normal respiratory effort, no problems with respiration noted  Abdomen: Soft, gravid, appropriate for gestational age.  Pain/Pressure: Present     Pelvic: Cervical exam deferred        Extremities: Normal range of motion.  Edema: None  Mental Status: Normal mood and affect. Normal behavior. Normal judgment and thought content.   Assessment and Plan:  Pregnancy: G4P0020 at [redacted]w[redacted]d 1. Supervision of other normal pregnancy, antepartum Patient is doing well without complaints AFP today Follow up ultrasound to complete anatomy scheduled  - Alpha fetoprotein, maternal  2. Supervision of high risk pregnancy, antepartum   3. History of gestational  diabetes in prior pregnancy, currently pregnant Normal early glucola Patient aware of need for repeat 2 hour at 26-28 weeks  4. History of IUFD   5. Maternal obesity affecting pregnancy, antepartum Continue ASA daily  Preterm labor symptoms and general obstetric precautions including but not limited to vaginal bleeding, contractions, leaking of fluid and fetal movement were reviewed in detail with the patient. Please refer to After Visit Summary for other counseling recommendations.   Return in about 1 week (around 02/12/2021) for in person, ROB, High risk.  Future Appointments  Date Time Provider Department Center  02/26/2021 11:15 AM [facility] NURSE [facility]  02/26/2021 11:30 AM [facility] US3 [facility]  02/26/2021 12:00 PM [facility] MD [facility]  03/05/2021 11:10 AM Milas Hock, MD [facility]  04/01/2021 10:15 AM [facility] LAB [facility] None  04/01/2021 10:45 AM [place], Brand Males, NP [facility] None    Catalina Antigua, MD

## 2021-02-06 LAB — ALPHA FETOPROTEIN, MATERNAL
AFP MoM: 0.81
AFP, Serum: 47.1 ng/mL
Calc'd Gestational Age: 20.7 weeks
Maternal Wt: 169 [lb_av]
Risk for ONTD: <1
Twins-AFP: 1

## 2021-02-26 ENCOUNTER — Other Ambulatory Visit: Payer: Self-pay | Admitting: Obstetrics and Gynecology

## 2021-02-26 ENCOUNTER — Other Ambulatory Visit: Payer: Self-pay

## 2021-02-26 ENCOUNTER — Ambulatory Visit: Payer: BC Managed Care – PPO | Admitting: *Deleted

## 2021-02-26 ENCOUNTER — Ambulatory Visit (HOSPITAL_BASED_OUTPATIENT_CLINIC_OR_DEPARTMENT_OTHER): Payer: BC Managed Care – PPO | Admitting: Obstetrics and Gynecology

## 2021-02-26 ENCOUNTER — Ambulatory Visit: Payer: BC Managed Care – PPO | Attending: Obstetrics and Gynecology

## 2021-02-26 ENCOUNTER — Encounter: Payer: Self-pay | Admitting: *Deleted

## 2021-02-26 VITALS — BP 105/60 | HR 79

## 2021-02-26 DIAGNOSIS — O09292 Supervision of pregnancy with other poor reproductive or obstetric history, second trimester: Secondary | ICD-10-CM

## 2021-02-26 DIAGNOSIS — Z3A23 23 weeks gestation of pregnancy: Secondary | ICD-10-CM | POA: Diagnosis not present

## 2021-02-26 DIAGNOSIS — E669 Obesity, unspecified: Secondary | ICD-10-CM

## 2021-02-26 DIAGNOSIS — O99210 Obesity complicating pregnancy, unspecified trimester: Secondary | ICD-10-CM

## 2021-02-26 DIAGNOSIS — O99212 Obesity complicating pregnancy, second trimester: Secondary | ICD-10-CM | POA: Diagnosis not present

## 2021-02-26 DIAGNOSIS — O99012 Anemia complicating pregnancy, second trimester: Secondary | ICD-10-CM

## 2021-02-26 DIAGNOSIS — O09299 Supervision of pregnancy with other poor reproductive or obstetric history, unspecified trimester: Secondary | ICD-10-CM | POA: Diagnosis present

## 2021-02-26 DIAGNOSIS — Z8632 Personal history of gestational diabetes: Secondary | ICD-10-CM

## 2021-02-26 DIAGNOSIS — Z48817 Encounter for surgical aftercare following surgery on the skin and subcutaneous tissue: Secondary | ICD-10-CM

## 2021-02-26 DIAGNOSIS — D649 Anemia, unspecified: Secondary | ICD-10-CM

## 2021-02-26 DIAGNOSIS — Z362 Encounter for other antenatal screening follow-up: Secondary | ICD-10-CM | POA: Diagnosis not present

## 2021-02-26 NOTE — Progress Notes (Signed)
Maternal-Fetal Medicine   Name: Michelle Knight DOB: [birth year] MRN: [id number] Referring Provider: Catalina Antigua, MD  I had the pleasure of seeing Ms. Walderp [facility] at the [facility].  She is here for follow-up fetal growth assessment and consultation.  Obstetric history significant for an intrauterine fetal death at [redacted] weeks gestation.  In April 2018, at [redacted] weeks gestation patient had preterm premature rupture of membranes.  She had decreased fetal movements before rupture of membranes.  On ultrasound evaluation fetal demise was confirmed.  Minimal ascites was seen on ultrasound.  Her pregnancy was complicated by gestational diabetes and the patient was not checking her blood glucose regularly.  Following diagnosis of intrauterine demise, she underwent induction of labor and delivered a female infant weighing 2,310 g at birth.  Fetal autopsy showed no evidence of abnormalities.  Placenta weighed 353 g with acute chorioamnionitis.  Three-vessel cord was seen.  Placental cord insertion was central.  There is a 1.1 cm hemorrhagic area suspicious of infarct.  No evidence of retroplacental clot. Acquired thrombophilia work-up was negative. No information is available on fetal karyotype or microarray. Past medical history: No history of diabetes or hypertension or any chronic medical conditions.  Patient reports she has anemia and had iron transfusions. Past surgical history: Nil of note. Medications: Prenatal vitamins. Allergies: Tramadol (hives). Social history: Denies tobacco or drug or alcohol use.  Patient states she is American [place].  Her partner is Caucasian, and he is in good health.  He is now the father of her first pregnancy.  Prenatal: On cell free fetal DNA screening, the risks of fetal aneuploidies are not increased.  MSAFP screening showed low risk for open neural tube defects. Early screening ruled out gestational diabetes.  Ultrasound Fetal growth is  appropriate for gestational age.  Amniotic fluid is normal and good fetal activity seen.  Fetal spine appears normal and anatomical survey was completed.  Labs: Hemoglobin 10.2, hematocrit 31.4.  Our concerns include History of stillbirth Stillbirths occur in about 6 per 1000 births in the [place] and the cause is unknown in about 50% of cases. Fetal autopsy did not show any anomalies. Placental histology was unremarkable except chorioamnionitis. She also did not have vaginal bleeding in her previous pregnancy to suggest abruption.   It is likely she had poorly-controlled diabetes that would have caused stillbirth. I explained the importance of good blood glucose control to prevent adverse fetal outcomes.   Gestational diabetes (GDM) is more common in women with history of GDM. It is reassuring to note that early screening ruled out GDM. If she develops GDM later in this pregnancy, good control is essential.  I counseled her that in the absence of any clear etiology, we would expect good outcomes.  I discussed our ultrasound protocol of serial fetal growth scans and initiating weekly antenatal testing at 32-34 weeks.   Timing of delivery: Provided antenatal testing is reassuring and she does not have other complications including gestational hypertension/preeclampsia/poorly-controlled GDM, she can be delivered at 74 weeks' gestation.  Recommendations -An appointment was made for her to return in 4 weeks for fetal growth assessment. Fetal growth assessment every 4 weeks. Weekly BPP from [redacted] weeks gestation till delivery.  Thank you for consultation.  If you have any questions or concerns, please contact me the [facility].  Consultation including face-to-face (more than 50%) counseling 30 minutes.

## 2021-03-04 ENCOUNTER — Encounter: Payer: Self-pay | Admitting: Obstetrics and Gynecology

## 2021-03-05 ENCOUNTER — Other Ambulatory Visit: Payer: Self-pay

## 2021-03-05 ENCOUNTER — Ambulatory Visit (INDEPENDENT_AMBULATORY_CARE_PROVIDER_SITE_OTHER): Payer: BC Managed Care – PPO | Admitting: Obstetrics and Gynecology

## 2021-03-05 ENCOUNTER — Encounter: Payer: Self-pay | Admitting: Obstetrics and Gynecology

## 2021-03-05 VITALS — BP 117/65 | HR 73 | Wt 169.0 lb

## 2021-03-05 DIAGNOSIS — Z8759 Personal history of other complications of pregnancy, childbirth and the puerperium: Secondary | ICD-10-CM

## 2021-03-05 DIAGNOSIS — O0990 Supervision of high risk pregnancy, unspecified, unspecified trimester: Secondary | ICD-10-CM

## 2021-03-05 DIAGNOSIS — O09299 Supervision of pregnancy with other poor reproductive or obstetric history, unspecified trimester: Secondary | ICD-10-CM

## 2021-03-05 DIAGNOSIS — Z8632 Personal history of gestational diabetes: Secondary | ICD-10-CM

## 2021-03-05 NOTE — Progress Notes (Signed)
   PRENATAL VISIT NOTE  Subjective:  Manal Kreutzer is a 20 y.o. G4P1020 at [redacted]w[redacted]d being seen today for ongoing prenatal care.  She is currently monitored for the following issues for this high-risk pregnancy and has IDA (iron deficiency anemia); Supervision of high risk pregnancy, antepartum; History of IUFD; History of gestational diabetes in prior pregnancy, currently pregnant; History of anxiety; Depression; Gestational diabetes; and Maternal obesity affecting pregnancy, antepartum on their problem list.  Patient reports occasional contractions.  Contractions: Irritability. Vag. Bleeding: None.  Movement: Present. Denies leaking of fluid.   The following portions of the patient's history were reviewed and updated as appropriate: allergies, current medications, past family history, past medical history, past social history, past surgical history and problem list.   Objective:   Vitals:   03/05/21 1121  BP: 117/65  Pulse: 73  Weight: 169 lb (76.7 kg)    Fetal Status: Fetal Heart Rate (bpm): 137 Fundal Height: 24 cm Movement: Present     General:  Alert, oriented and cooperative. Patient is in no acute distress.  Skin: Skin is warm and dry. No rash noted.   Cardiovascular: Normal heart rate noted  Respiratory: Normal respiratory effort, no problems with respiration noted  Abdomen: Soft, gravid, appropriate for gestational age.  Pain/Pressure: Present     Pelvic: Cervical exam deferred        Extremities: Normal range of motion.  Edema: None  Mental Status: Normal mood and affect. Normal behavior. Normal judgment and thought content.   Assessment and Plan:  Pregnancy: G4P1020 at [redacted]w[redacted]d 1. Supervision of high risk pregnancy, antepartum - Prenatal care and labs up to date - f/u in 4 weeks - routine 28w labs at that time - Discussed tdap  2. History of IUFD - She will continue serial growth [place] - next scheduled for 9/29 - Weekly BPP and NST at 32w until delivery - Plan for  delivery at 39w  3. History of gestational diabetes in prior pregnancy, currently pregnant - Early abnormal 1 hr, but normal 2hr - Routine testing next visit  Preterm labor symptoms and general obstetric precautions including but not limited to vaginal bleeding, contractions, leaking of fluid and fetal movement were reviewed in detail with the patient. Please refer to After Visit Summary for other counseling recommendations.   Return in about 4 weeks (around 04/02/2021) for 2 hr GTT.  Future Appointments  Date Time Provider Department Center  04/01/2021 10:15 AM [facility] LAB [facility] None  04/01/2021 10:45 AM [place], Brand Males, NP [facility] None  04/02/2021  8:50 AM Milas Hock, MD [facility]  04/02/2021  1:00 PM [facility] NURSE [facility]  04/02/2021  1:15 PM [facility] US2 [facility]    Milas Hock, MD

## 2021-03-31 NOTE — Progress Notes (Signed)
   PRENATAL VISIT NOTE  Subjective:  Michelle Knight is a 20 y.o. G4P1020 at [redacted]w[redacted]d being seen today for ongoing prenatal care.  She is currently monitored for the following issues for this high-risk pregnancy and has IDA (iron deficiency anemia); Supervision of high risk pregnancy, antepartum; History of IUFD; History of gestational diabetes in prior pregnancy, currently pregnant; History of anxiety; Depression; Gestational diabetes; and Maternal obesity affecting pregnancy, antepartum on their problem list.  Patient reports fatigue.  Contractions: Irritability. Vag. Bleeding: None.  Movement: Present. Denies leaking of fluid.   The following portions of the patient's history were reviewed and updated as appropriate: allergies, current medications, past family history, past medical history, past social history, past surgical history and problem list.   Objective:   Vitals:   04/02/21 0901  BP: 104/69  Pulse: 86  Weight: 171 lb (77.6 kg)    Fetal Status: Fetal Heart Rate (bpm): 139 Fundal Height: 28 cm Movement: Present     General:  Alert, oriented and cooperative. Patient is in no acute distress.  Skin: Skin is warm and dry. No rash noted.   Cardiovascular: Normal heart rate noted  Respiratory: Normal respiratory effort, no problems with respiration noted  Abdomen: Soft, gravid, appropriate for gestational age.  Pain/Pressure: Absent     Pelvic: Cervical exam deferred        Extremities: Normal range of motion.  Edema: None  Mental Status: Normal mood and affect. Normal behavior. Normal judgment and thought content.   Assessment and Plan:  Pregnancy: G4P1020 at [redacted]w[redacted]d 1. Supervision of high risk pregnancy, antepartum - 28w labs today - Tdap offered and she accepts - Discussed flu shot -- she would like to get it next time  2. History of IUFD - Weekly BPP and NST until delivery starting at 32 wks - Growth q4 wks - done 9/29  3. History of gestational diabetes in prior  pregnancy, currently pregnant - Early 1 hr abnormal but normal 2 hr at that time - Doing 2 hr labs today. We discussed if normal, I would suggest still adhering closely to a DM diet. She reports she is eating minimal carbs at this time as well. TWG -7lbs  4. Maternal obesity affecting pregnancy, antepartum   Preterm labor symptoms and general obstetric precautions including but not limited to vaginal bleeding, contractions, leaking of fluid and fetal movement were reviewed in detail with the patient. Please refer to After Visit Summary for other counseling recommendations.   Return in about 2 weeks (around 04/16/2021) for OB VISIT, MD only.  Future Appointments  Date Time Provider Department Center  04/02/2021  1:00 PM [facility] NURSE [facility]  04/02/2021  1:15 PM [facility] US2 [facility]  05/04/2021 11:15 AM [facility] LAB [facility] None  05/04/2021 11:45 AM Erenest Blank, NP [facility] None    Milas Hock, MD

## 2021-04-01 ENCOUNTER — Inpatient Hospital Stay: Payer: BC Managed Care – PPO

## 2021-04-01 ENCOUNTER — Other Ambulatory Visit: Payer: Self-pay

## 2021-04-01 ENCOUNTER — Inpatient Hospital Stay: Payer: BC Managed Care – PPO | Admitting: Family

## 2021-04-02 ENCOUNTER — Ambulatory Visit: Payer: BC Managed Care – PPO | Attending: Obstetrics and Gynecology

## 2021-04-02 ENCOUNTER — Ambulatory Visit (INDEPENDENT_AMBULATORY_CARE_PROVIDER_SITE_OTHER): Payer: BC Managed Care – PPO | Admitting: Obstetrics and Gynecology

## 2021-04-02 ENCOUNTER — Encounter: Payer: Self-pay | Admitting: *Deleted

## 2021-04-02 ENCOUNTER — Ambulatory Visit: Payer: BC Managed Care – PPO | Admitting: *Deleted

## 2021-04-02 ENCOUNTER — Encounter: Payer: Self-pay | Admitting: Obstetrics and Gynecology

## 2021-04-02 VITALS — BP 104/69 | HR 86 | Wt 171.0 lb

## 2021-04-02 VITALS — BP 121/56 | HR 78

## 2021-04-02 DIAGNOSIS — O99012 Anemia complicating pregnancy, second trimester: Secondary | ICD-10-CM | POA: Insufficient documentation

## 2021-04-02 DIAGNOSIS — O99013 Anemia complicating pregnancy, third trimester: Secondary | ICD-10-CM

## 2021-04-02 DIAGNOSIS — O09299 Supervision of pregnancy with other poor reproductive or obstetric history, unspecified trimester: Secondary | ICD-10-CM

## 2021-04-02 DIAGNOSIS — O0990 Supervision of high risk pregnancy, unspecified, unspecified trimester: Secondary | ICD-10-CM

## 2021-04-02 DIAGNOSIS — Z23 Encounter for immunization: Secondary | ICD-10-CM

## 2021-04-02 DIAGNOSIS — Z362 Encounter for other antenatal screening follow-up: Secondary | ICD-10-CM | POA: Diagnosis present

## 2021-04-02 DIAGNOSIS — Z3A28 28 weeks gestation of pregnancy: Secondary | ICD-10-CM

## 2021-04-02 DIAGNOSIS — O99210 Obesity complicating pregnancy, unspecified trimester: Secondary | ICD-10-CM

## 2021-04-02 DIAGNOSIS — O09293 Supervision of pregnancy with other poor reproductive or obstetric history, third trimester: Secondary | ICD-10-CM | POA: Diagnosis not present

## 2021-04-02 DIAGNOSIS — Z8632 Personal history of gestational diabetes: Secondary | ICD-10-CM

## 2021-04-02 DIAGNOSIS — O99212 Obesity complicating pregnancy, second trimester: Secondary | ICD-10-CM | POA: Diagnosis present

## 2021-04-02 DIAGNOSIS — O99213 Obesity complicating pregnancy, third trimester: Secondary | ICD-10-CM | POA: Diagnosis not present

## 2021-04-02 DIAGNOSIS — Z8759 Personal history of other complications of pregnancy, childbirth and the puerperium: Secondary | ICD-10-CM

## 2021-04-03 ENCOUNTER — Other Ambulatory Visit: Payer: Self-pay | Admitting: *Deleted

## 2021-04-03 DIAGNOSIS — O09293 Supervision of pregnancy with other poor reproductive or obstetric history, third trimester: Secondary | ICD-10-CM
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
## 2021-04-16 ENCOUNTER — Ambulatory Visit (INDEPENDENT_AMBULATORY_CARE_PROVIDER_SITE_OTHER): Payer: BC Managed Care – PPO | Admitting: Obstetrics and Gynecology

## 2021-04-16 ENCOUNTER — Other Ambulatory Visit: Payer: Self-pay

## 2021-04-16 ENCOUNTER — Encounter: Payer: Self-pay | Admitting: Obstetrics and Gynecology

## 2021-04-16 VITALS — BP 119/61 | HR 93 | Wt 175.0 lb

## 2021-04-16 DIAGNOSIS — Z23 Encounter for immunization: Secondary | ICD-10-CM

## 2021-04-16 DIAGNOSIS — O0990 Supervision of high risk pregnancy, unspecified, unspecified trimester: Secondary | ICD-10-CM

## 2021-04-16 DIAGNOSIS — O99210 Obesity complicating pregnancy, unspecified trimester: Secondary | ICD-10-CM

## 2021-04-16 DIAGNOSIS — O09299 Supervision of pregnancy with other poor reproductive or obstetric history, unspecified trimester: Secondary | ICD-10-CM

## 2021-04-16 DIAGNOSIS — Z8632 Personal history of gestational diabetes: Secondary | ICD-10-CM

## 2021-04-16 DIAGNOSIS — Z8759 Personal history of other complications of pregnancy, childbirth and the puerperium: Secondary | ICD-10-CM

## 2021-04-20 ENCOUNTER — Other Ambulatory Visit (INDEPENDENT_AMBULATORY_CARE_PROVIDER_SITE_OTHER): Payer: BC Managed Care – PPO

## 2021-04-20 ENCOUNTER — Other Ambulatory Visit: Payer: Self-pay

## 2021-04-20 DIAGNOSIS — Z3A31 31 weeks gestation of pregnancy: Secondary | ICD-10-CM

## 2021-04-20 DIAGNOSIS — O0990 Supervision of high risk pregnancy, unspecified, unspecified trimester: Secondary | ICD-10-CM

## 2021-04-20 NOTE — Progress Notes (Signed)
Pt here for 2 hour GTT. Pt given lab orders and sent to lab.  

## 2021-04-21 LAB — CBC
HCT: 35.8 % (ref 35.0–45.0)
Hemoglobin: 11.7 g/dL (ref 11.7–15.5)
MCH: 27.2 pg (ref 27.0–33.0)
MCHC: 32.7 g/dL (ref 32.0–36.0)
MCV: 83.3 fL (ref 80.0–100.0)
MPV: 10.9 fL (ref 7.5–12.5)
Platelets: 244 10*3/uL (ref 140–400)
RBC: 4.3 10*6/uL (ref 3.80–5.10)
RDW: 13.1 % (ref 11.0–15.0)
WBC: 8.6 10*3/uL (ref 3.8–10.8)

## 2021-04-21 LAB — 2HR GTT W 1 HR, CARPENTER, 75 G
Glucose, 1 Hr, Gest: 95 mg/dL (ref 65–179)
Glucose, 2 Hr, Gest: 92 mg/dL (ref 65–152)
Glucose, Fasting, Gest: 79 mg/dL (ref 65–91)

## 2021-04-21 LAB — HIV ANTIBODY (ROUTINE TESTING W REFLEX): HIV 1&2 Ab, 4th Generation: NONREACTIVE

## 2021-04-21 LAB — RPR: RPR Ser Ql: NONREACTIVE

## 2021-04-30 ENCOUNTER — Ambulatory Visit: Payer: BC Managed Care – PPO | Attending: Obstetrics and Gynecology

## 2021-04-30 ENCOUNTER — Other Ambulatory Visit: Payer: Self-pay | Admitting: *Deleted

## 2021-04-30 ENCOUNTER — Ambulatory Visit (INDEPENDENT_AMBULATORY_CARE_PROVIDER_SITE_OTHER): Payer: BC Managed Care – PPO | Admitting: Obstetrics & Gynecology

## 2021-04-30 ENCOUNTER — Other Ambulatory Visit: Payer: Self-pay

## 2021-04-30 ENCOUNTER — Encounter: Payer: Self-pay | Admitting: Family

## 2021-04-30 ENCOUNTER — Encounter: Payer: Self-pay | Admitting: *Deleted

## 2021-04-30 ENCOUNTER — Ambulatory Visit: Payer: BC Managed Care – PPO | Admitting: *Deleted

## 2021-04-30 ENCOUNTER — Encounter: Payer: Self-pay | Admitting: Obstetrics & Gynecology

## 2021-04-30 VITALS — BP 122/57 | HR 86 | Wt 176.0 lb

## 2021-04-30 VITALS — BP 114/67 | HR 95

## 2021-04-30 DIAGNOSIS — O09293 Supervision of pregnancy with other poor reproductive or obstetric history, third trimester: Secondary | ICD-10-CM | POA: Diagnosis not present

## 2021-04-30 DIAGNOSIS — O99210 Obesity complicating pregnancy, unspecified trimester: Secondary | ICD-10-CM

## 2021-04-30 DIAGNOSIS — Z8759 Personal history of other complications of pregnancy, childbirth and the puerperium: Secondary | ICD-10-CM

## 2021-04-30 DIAGNOSIS — O99013 Anemia complicating pregnancy, third trimester: Secondary | ICD-10-CM | POA: Diagnosis not present

## 2021-04-30 DIAGNOSIS — Z3A32 32 weeks gestation of pregnancy: Secondary | ICD-10-CM

## 2021-04-30 DIAGNOSIS — O0990 Supervision of high risk pregnancy, unspecified, unspecified trimester: Secondary | ICD-10-CM

## 2021-04-30 DIAGNOSIS — D649 Anemia, unspecified: Secondary | ICD-10-CM | POA: Diagnosis not present

## 2021-04-30 DIAGNOSIS — O0993 Supervision of high risk pregnancy, unspecified, third trimester: Secondary | ICD-10-CM | POA: Diagnosis not present

## 2021-04-30 DIAGNOSIS — O09523 Supervision of elderly multigravida, third trimester: Secondary | ICD-10-CM | POA: Diagnosis not present

## 2021-04-30 DIAGNOSIS — Z8632 Personal history of gestational diabetes: Secondary | ICD-10-CM

## 2021-04-30 NOTE — Patient Instructions (Signed)
Return to office for any scheduled appointments. Call the office or go to the MAU at [facility] if:  You begin to have strong, frequent contractions  Your water breaks.  Sometimes it is a big gush of fluid, sometimes it is just a trickle that keeps getting your panties wet or running down your legs  You have vaginal bleeding.  It is normal to have a small amount of spotting if your cervix was checked.   You do not feel your baby moving like normal.  If you do not, get something to eat and drink and lay down and focus on feeling your baby move.   If your baby is still not moving like normal, you should call the office or go to MAU.  Any other obstetric concerns.   

## 2021-04-30 NOTE — Progress Notes (Signed)
   PRENATAL VISIT NOTE  Subjective:  Michelle Knight is a 20 y.o. G4P1020 at [redacted]w[redacted]d being seen today for ongoing prenatal care.  She is currently monitored for the following issues for this high-risk pregnancy and has Supervision of high risk pregnancy, antepartum; History of IUFD; History of gestational diabetes in prior pregnancy, currently pregnant; History of anxiety; Depression; and Maternal obesity affecting pregnancy, antepartum on their problem list.  Patient reports no significant complaints.  Contractions: Irritability. Vag. Bleeding: None.  Movement: Present. Denies leaking of fluid.   The following portions of the patient's history were reviewed and updated as appropriate: allergies, current medications, past family history, past medical history, past social history, past surgical history and problem list.   Objective:   Vitals:   04/30/21 1043  BP: (!) 122/57  Pulse: 86  Weight: 176 lb (79.8 kg)    Fetal Status: Fetal Heart Rate (bpm): NST-R   Movement: Present     General:  Alert, oriented and cooperative. Patient is in no acute distress.  Skin: Skin is warm and dry. No rash noted.   Cardiovascular: Normal heart rate noted  Respiratory: Normal respiratory effort, no problems with respiration noted  Abdomen: Soft, gravid, appropriate for gestational age.  Pain/Pressure: Absent     Pelvic: Cervical exam deferred        Extremities: Normal range of motion.  Edema: Trace  Mental Status: Normal mood and affect. Normal behavior. Normal judgment and thought content.   Assessment and Plan:  Pregnancy: G4P1020 at [redacted]w[redacted]d 1. History of IUFD at 38 weeks NST performed today was reviewed and was found to be reactive.  Will need weekly BPPs. Growth scan today. Will follow up MFM recommendations regarding timing of delivery.  Strict FM precautions reviewed.  - [place] MFM FETAL BPP WO NON STRESS; Future - [place] MFM FETAL BPP WO NON STRESS; Future  2. [redacted] weeks gestation of pregnancy 3.  Supervision of high risk pregnancy, antepartum Preterm labor symptoms and general obstetric precautions including but not limited to vaginal bleeding, contractions, leaking of fluid and fetal movement were reviewed in detail with the patient. Please refer to After Visit Summary for other counseling recommendations.   Return for OFFICE OB VISIT (MD only).  Future Appointments  Date Time Provider Department Center  04/30/2021  3:15 PM [facility] NURSE [facility] [place]  04/30/2021  3:30 PM [facility] US3 [facility]  05/04/2021 11:15 AM [facility] None  05/04/2021 11:45 AM Erenest Blank, NP [facility] None  05/14/2021 11:10 AM Milas Hock, MD [facility]    Jaynie Collins, MD

## 2021-05-04 ENCOUNTER — Inpatient Hospital Stay: Payer: BC Managed Care – PPO | Attending: Hematology & Oncology

## 2021-05-04 ENCOUNTER — Other Ambulatory Visit: Payer: Self-pay

## 2021-05-04 ENCOUNTER — Other Ambulatory Visit: Payer: Self-pay | Admitting: Family

## 2021-05-04 ENCOUNTER — Telehealth: Payer: Self-pay | Admitting: *Deleted

## 2021-05-04 ENCOUNTER — Inpatient Hospital Stay (HOSPITAL_BASED_OUTPATIENT_CLINIC_OR_DEPARTMENT_OTHER): Payer: BC Managed Care – PPO | Admitting: Family

## 2021-05-04 ENCOUNTER — Encounter: Payer: Self-pay | Admitting: Family

## 2021-05-04 VITALS — BP 97/59 | Wt 176.0 lb

## 2021-05-04 DIAGNOSIS — R202 Paresthesia of skin: Secondary | ICD-10-CM | POA: Insufficient documentation

## 2021-05-04 DIAGNOSIS — R002 Palpitations: Secondary | ICD-10-CM | POA: Diagnosis not present

## 2021-05-04 DIAGNOSIS — Z3A00 Weeks of gestation of pregnancy not specified: Secondary | ICD-10-CM | POA: Insufficient documentation

## 2021-05-04 DIAGNOSIS — Z885 Allergy status to narcotic agent status: Secondary | ICD-10-CM | POA: Diagnosis not present

## 2021-05-04 DIAGNOSIS — Z79899 Other long term (current) drug therapy: Secondary | ICD-10-CM | POA: Insufficient documentation

## 2021-05-04 DIAGNOSIS — Z888 Allergy status to other drugs, medicaments and biological substances status: Secondary | ICD-10-CM | POA: Diagnosis not present

## 2021-05-04 DIAGNOSIS — R200 Anesthesia of skin: Secondary | ICD-10-CM | POA: Diagnosis not present

## 2021-05-04 DIAGNOSIS — D509 Iron deficiency anemia, unspecified: Secondary | ICD-10-CM

## 2021-05-04 DIAGNOSIS — O99012 Anemia complicating pregnancy, second trimester: Secondary | ICD-10-CM | POA: Diagnosis present

## 2021-05-04 LAB — CBC WITH DIFFERENTIAL (CANCER CENTER ONLY)
Abs Immature Granulocytes: 0.24 10*3/uL — ABNORMAL HIGH (ref 0.00–0.07)
Basophils Absolute: 0 10*3/uL (ref 0.0–0.1)
Basophils Relative: 0 %
Eosinophils Absolute: 0 10*3/uL (ref 0.0–0.5)
Eosinophils Relative: 0 %
HCT: 34.8 % — ABNORMAL LOW (ref 36.0–46.0)
Hemoglobin: 11.4 g/dL — ABNORMAL LOW (ref 12.0–15.0)
Immature Granulocytes: 2 %
Lymphocytes Relative: 19 %
Lymphs Abs: 1.9 10*3/uL (ref 0.7–4.0)
MCH: 26.8 pg (ref 26.0–34.0)
MCHC: 32.8 g/dL (ref 30.0–36.0)
MCV: 81.7 fL (ref 80.0–100.0)
Monocytes Absolute: 0.5 10*3/uL (ref 0.1–1.0)
Monocytes Relative: 6 %
Neutro Abs: 7.1 10*3/uL (ref 1.7–7.7)
Neutrophils Relative %: 73 %
Platelet Count: 236 10*3/uL (ref 150–400)
RBC: 4.26 MIL/uL (ref 3.87–5.11)
RDW: 13.4 % (ref 11.5–15.5)
WBC Count: 9.8 10*3/uL (ref 4.0–10.5)
nRBC: 0 % (ref 0.0–0.2)

## 2021-05-04 LAB — RETICULOCYTES
Immature Retic Fract: 22.7 % — ABNORMAL HIGH (ref 2.3–15.9)
RBC.: 4.15 MIL/uL (ref 3.87–5.11)
Retic Count, Absolute: 83.4 10*3/uL (ref 19.0–186.0)
Retic Ct Pct: 2 % (ref 0.4–3.1)

## 2021-05-04 NOTE — Progress Notes (Signed)
Hematology and Oncology Follow Up Visit  Michelle Knight [id number] October 15, 2000 20 y.o. 05/04/2021   Principle Diagnosis:  Iron deficiency anemia  Pregnant - due 06/20/2021    Current Therapy:        Iron PO if needed    Interim History:  Ms. Michelle Knight is here today for follow-up. She is doing well with her pregnancy. She states that they plan to induce her at 38 weeks.  She has mild SOB at time with exertion which she feels is due to her pregnancy and the baby taking up room. She has also noted "brain fog" and palpitations.  She is chewing lots of ice.  Hgb is 11.4, MCV 81, platelets 236 and WBC count 9.8.  No fever, chills, n/v, cough, rash, dizziness, chest pain, abdominal pain or changes in bowel or bladder habits.  No episodes of blood loss. No bruising or petechiae.  No swelling or tenderness in her extremities.  She notes intermittent random numbness and tingling in her hands.  No falls or syncope to report.  She has maintained a good appetite and is staying well hydrated. Her weight is stable at 176 lbs.   ECOG Performance Status: 1 - Symptomatic but completely ambulatory  Medications:  Allergies as of 05/04/2021       Reactions   Other Itching, Swelling, Other (See Comments)   Certain exotic fruits...mango, dragonfruit, papaya,etc. Tongue swells.   Mixed Grasses Itching   Tramadol Hives        Medication List        Accurate as of May 04, 2021 12:04 PM. If you have any questions, ask your nurse or doctor.          aspirin EC 81 MG tablet Take 81 mg by mouth daily. Swallow whole.   PRENATAL VITAMINS PO Take by mouth.        Allergies:  Allergies  Allergen Reactions   Other Itching, Swelling and Other (See Comments)    Certain exotic fruits...mango, dragonfruit, papaya,etc. Tongue swells.   Mixed Grasses Itching   Tramadol Hives    Past Medical History, Surgical history, Social history, and Family History were reviewed and  updated.  Review of Systems: All other 10 point review of systems is negative.   Physical Exam:  weight is 176 lb (79.8 kg). Her blood pressure is 97/59 (abnormal).   Wt Readings from Last 3 Encounters:  05/04/21 176 lb (79.8 kg)  04/30/21 176 lb (79.8 kg)  04/16/21 175 lb (79.4 kg)    Ocular: Sclerae unicteric, pupils equal, round and reactive to light Ear-nose-throat: Oropharynx clear, dentition fair Lymphatic: No cervical or supraclavicular adenopathy Lungs no rales or rhonchi, good excursion bilaterally Heart regular rate and rhythm, no murmur appreciated Abd soft, nontender, positive bowel sounds MSK no focal spinal tenderness, no joint edema Neuro: non-focal, well-oriented, appropriate affect Breasts: Deferred   Lab Results  Component Value Date   WBC 9.8 05/04/2021   HGB 11.4 (L) 05/04/2021   HCT 34.8 (L) 05/04/2021   MCV 81.7 05/04/2021   PLT 236 05/04/2021   Lab Results  Component Value Date   FERRITIN 58 12/30/2020   IRON 75 12/30/2020   TIBC 423 12/30/2020   UIBC 348 12/30/2020   IRONPCTSAT 18 12/30/2020   Lab Results  Component Value Date   RETICCTPCT 2.0 05/04/2021   RBC 4.26 05/04/2021   RBC 4.15 05/04/2021   No results found for: KPAFRELGTCHN, LAMBDASER, KAPLAMBRATIO No results found for: IGGSERUM, IGA, IGMSERUM No results found for:  Marda Stalker, SPEI   Chemistry      Component Value Date/Time   NA 138 04/29/2020 1342   K 3.8 04/29/2020 1342   CL 104 04/29/2020 1342   CO2 26 04/29/2020 1342   BUN 9 04/29/2020 1342   CREATININE 0.61 04/29/2020 1342      Component Value Date/Time   CALCIUM 9.5 04/29/2020 1342   ALKPHOS 89 04/29/2020 1342   AST 14 (L) 04/29/2020 1342   ALT 12 04/29/2020 1342   BILITOT 0.3 04/29/2020 1342       Impression and Plan: Ms. Michelle Knight is a very pleasant 20 yo female with iron deficiency anemia.   She continues to do well and states that she will likely be  induced at 38 weeks.  Iron studies are pending. We will have her start a supplement if needed.  Follow-up in 7-8 weeks.  She will contact our office with any questions or concerns.   Eileen Stanford, NP 10/31/202212:04 PM

## 2021-05-04 NOTE — Telephone Encounter (Signed)
Per 05/04/21 los called and was unable to lvm - mailbox is full - mailed calendar

## 2021-05-05 ENCOUNTER — Other Ambulatory Visit: Payer: Self-pay | Admitting: Family

## 2021-05-05 ENCOUNTER — Encounter: Payer: Self-pay | Admitting: *Deleted

## 2021-05-05 DIAGNOSIS — D509 Iron deficiency anemia, unspecified: Secondary | ICD-10-CM

## 2021-05-05 DIAGNOSIS — E7211 Homocystinuria: Secondary | ICD-10-CM

## 2021-05-05 DIAGNOSIS — R7983 Abnormal findings of blood amino-acid level: Secondary | ICD-10-CM

## 2021-05-05 LAB — FERRITIN: Ferritin: 6 ng/mL — ABNORMAL LOW (ref 11–307)

## 2021-05-05 LAB — IRON AND TIBC
Iron: 62 ug/dL (ref 41–142)
Saturation Ratios: 11 % — ABNORMAL LOW (ref 21–57)
TIBC: 549 ug/dL — ABNORMAL HIGH (ref 236–444)
UIBC: 487 ug/dL — ABNORMAL HIGH (ref 120–384)

## 2021-05-05 MED ORDER — FOLIC ACID 1 MG PO TABS: 1.0000 mg | ORAL_TABLET | Freq: Every day | ORAL | 11 refills | Status: DC

## 2021-05-07 ENCOUNTER — Ambulatory Visit: Payer: BC Managed Care – PPO | Admitting: *Deleted

## 2021-05-07 ENCOUNTER — Other Ambulatory Visit: Payer: Self-pay

## 2021-05-07 ENCOUNTER — Encounter: Payer: Self-pay | Admitting: *Deleted

## 2021-05-07 ENCOUNTER — Ambulatory Visit: Payer: BC Managed Care – PPO | Attending: Obstetrics

## 2021-05-07 VITALS — BP 109/56 | HR 88

## 2021-05-07 DIAGNOSIS — Z6838 Body mass index (BMI) 38.0-38.9, adult: Secondary | ICD-10-CM

## 2021-05-07 DIAGNOSIS — Z3A33 33 weeks gestation of pregnancy: Secondary | ICD-10-CM | POA: Diagnosis not present

## 2021-05-07 DIAGNOSIS — Z8759 Personal history of other complications of pregnancy, childbirth and the puerperium: Secondary | ICD-10-CM | POA: Insufficient documentation

## 2021-05-07 DIAGNOSIS — O99013 Anemia complicating pregnancy, third trimester: Secondary | ICD-10-CM | POA: Diagnosis not present

## 2021-05-07 DIAGNOSIS — O99213 Obesity complicating pregnancy, third trimester: Secondary | ICD-10-CM

## 2021-05-07 DIAGNOSIS — E669 Obesity, unspecified: Secondary | ICD-10-CM

## 2021-05-07 DIAGNOSIS — O09293 Supervision of pregnancy with other poor reproductive or obstetric history, third trimester: Secondary | ICD-10-CM

## 2021-05-07 DIAGNOSIS — O99210 Obesity complicating pregnancy, unspecified trimester: Secondary | ICD-10-CM | POA: Insufficient documentation

## 2021-05-12 ENCOUNTER — Ambulatory Visit: Payer: BC Managed Care – PPO

## 2021-05-12 ENCOUNTER — Other Ambulatory Visit: Payer: BC Managed Care – PPO

## 2021-05-13 ENCOUNTER — Encounter: Payer: Self-pay | Admitting: Family

## 2021-05-13 NOTE — Telephone Encounter (Signed)
Spoke with pt who wanted to know if she should take both the Gentle Iron and the Fusion Plus. Advised her that no she only needed to take one. Michelle Knight just had suggested the Gentle Iron if she could not get the Fusion Plus or if she was waiting for a shipment. Pt stated understanding.

## 2021-05-14 ENCOUNTER — Other Ambulatory Visit: Payer: Self-pay

## 2021-05-14 ENCOUNTER — Encounter: Payer: Self-pay | Admitting: Obstetrics and Gynecology

## 2021-05-14 ENCOUNTER — Telehealth (INDEPENDENT_AMBULATORY_CARE_PROVIDER_SITE_OTHER): Payer: BC Managed Care – PPO | Admitting: Obstetrics and Gynecology

## 2021-05-14 VITALS — BP 119/74 | Wt 178.0 lb

## 2021-05-14 DIAGNOSIS — O09293 Supervision of pregnancy with other poor reproductive or obstetric history, third trimester: Secondary | ICD-10-CM

## 2021-05-14 DIAGNOSIS — Z3A34 34 weeks gestation of pregnancy: Secondary | ICD-10-CM

## 2021-05-14 DIAGNOSIS — E669 Obesity, unspecified: Secondary | ICD-10-CM

## 2021-05-14 DIAGNOSIS — O0990 Supervision of high risk pregnancy, unspecified, unspecified trimester: Secondary | ICD-10-CM

## 2021-05-14 DIAGNOSIS — Z8759 Personal history of other complications of pregnancy, childbirth and the puerperium: Secondary | ICD-10-CM

## 2021-05-14 DIAGNOSIS — O99210 Obesity complicating pregnancy, unspecified trimester: Secondary | ICD-10-CM

## 2021-05-14 DIAGNOSIS — O99213 Obesity complicating pregnancy, third trimester: Secondary | ICD-10-CM

## 2021-05-14 NOTE — Progress Notes (Signed)
OBSTETRICS PRENATAL VIRTUAL VISIT ENCOUNTER NOTE  Provider location: [facility] for [facility] [place]   Patient location: Home  I connected with Michelle Knight on 05/14/21 at 11:10 AM EST by MyChart Video Encounter and verified that I am speaking with the correct person using two identifiers. I discussed the limitations, risks, security and privacy concerns of performing an evaluation and management service virtually and the availability of in person appointments. I also discussed with the patient that there may be a patient responsible charge related to this service. The patient expressed understanding and agreed to proceed. Subjective:  Michelle Knight is a 20 y.o. G4P1020 at [redacted]w[redacted]d being seen today for ongoing prenatal care.  She is currently monitored for the following issues for this high-risk pregnancy and has Supervision of high risk pregnancy, antepartum; History of IUFD; History of gestational diabetes in prior pregnancy, currently pregnant; History of anxiety; Depression; Maternal obesity affecting pregnancy, antepartum; and IDA (iron deficiency anemia) on their problem list.  Patient reports backache.  Contractions: Not present. Vag. Bleeding: None.  Movement: Present. Denies any leaking of fluid.   The following portions of the patient's history were reviewed and updated as appropriate: allergies, current medications, past family history, past medical history, past social history, past surgical history and problem list.   Objective:   Vitals:   05/14/21 1111  BP: 119/74  Weight: 178 lb (80.7 kg)    Fetal Status:     Movement: Present     General:  Alert, oriented and cooperative. Patient is in no acute distress.  Respiratory: Normal respiratory effort, no problems with respiration noted  Mental Status: Normal mood and affect. Normal behavior. Normal judgment and thought content.  Rest of physical exam deferred due to type of encounter  Imaging: [place] MFM  FETAL BPP WO NON STRESS  Result Date: 05/07/2021 ----------------------------------------------------------------------  OBSTETRICS REPORT                       (Signed Final 05/07/2021 11:40 am) ---------------------------------------------------------------------- Patient Info  ID #:       [id number]                          D.O.B.:  2001/05/01 (20 yrs)  Name:       Michelle Knight               Visit Date: 05/07/2021 11:19 am ---------------------------------------------------------------------- Performed By  Attending:        Noralee Space MD        Ref. Address:     [address] [place]  Performed By:     Tommie Raymond BS,       [facility]  Referred By:      Everardo All ---------------------------------------------------------------------- Orders  #  Description                           Code        Ordered By  1  [place] MFM FETAL BPP WO NON               16109.60    Jaynie Collins     STRESS ----------------------------------------------------------------------  #  Order #                     Accession #                Episode #  1  [id number]                   [id number]                 [id number] ---------------------------------------------------------------------- Indications  [redacted] weeks gestation of pregnancy                Z3A.33  Obesity complicating pregnancy, third          O99.213  trimester BMI 38  Poor obstetric history: Previous IUFD          O09.299  (stillbirth at 38 weeks)  Poor obstetric history: Previous gestational   O09.299  diabetes  Anemia during pregnancy in third trimester     O99.013  LR NIPS (female)  Negative Horizon  ---------------------------------------------------------------------- Fetal Evaluation  Num Of Fetuses:         1  Fetal Heart Rate(bpm):  158  Cardiac Activity:       Observed  Presentation:           Cephalic  Placenta:               Anterior  P. Cord Insertion:      Previously Visualized  Amniotic Fluid  AFI FV:      Within normal limits  AFI Sum(cm)     %Tile       Largest Pocket(cm)  8.7             9           3.9  RUQ(cm)       RLQ(cm)       LUQ(cm)        LLQ(cm)  2.8           0             2.1            3.9 ---------------------------------------------------------------------- Biophysical Evaluation  Amniotic F.V:   Pocket => 2 cm             F. Tone:        Observed  F. Movement:    Observed                   Score:          8/8  F. Breathing:   Observed ---------------------------------------------------------------------- Biometry  LV:  7.5  mm ---------------------------------------------------------------------- OB History  Gravidity:    4         Term:   1         SAB:   2  Living:       0 ---------------------------------------------------------------------- Gestational Age  LMP:           33w 5d        Date:  09/13/20                 EDD:   06/20/21  Best:          33w 5d     Det. By:  LMP  (09/13/20)          EDD:   06/20/21 ---------------------------------------------------------------------- Anatomy  Cranium:               Appears normal         LVOT:                   Previously seen  Cavum:                 Previously seen        Aortic Arch:            Previously seen  Ventricles:            Appears normal         Ductal Arch:            Previously seen  Choroid Plexus:        Previously seen        Diaphragm:              Appears normal  Cerebellum:            Previously seen        Stomach:                Appears normal, left                                                                        sided  Posterior Fossa:       Previously seen        Abdomen:                Previously  seen  Nuchal Fold:           Previously seen        Abdominal Wall:         Previously seen  Face:                  Orbits and profile     Cord Vessels:           Previously seen                         previously seen  Lips:                  Previously seen        Kidneys:                Appear normal  Palate:  Previously seen        Bladder:                Appears normal  Thoracic:              Appears normal         Spine:                  Previously seen  Heart:                 Appears normal         Upper Extremities:      Previously seen                         (4CH, axis, and                         situs)  RVOT:                  Previously seen        Lower Extremities:      Previously seen  Other:  Fetus appears to be a female. Nasal bone and lenses previously          visualized. VC, 3VV previously visualized. Right Heel/feet and open          hands/left 5th digit previously visualized. adv ---------------------------------------------------------------------- Cervix Uterus Adnexa  Cervix  Not visualized (advanced GA >24wks)  Uterus  No abnormality visualized.  Right Ovary  Within normal limits.  Left Ovary  Within normal limits.  Cul De Sac  No free fluid seen.  Adnexa  No abnormality visualized. ---------------------------------------------------------------------- Impression  History of stillbirth.  Patient does not have gestational  diabetes.  Blood pressure today at her office is 109/56 mmHg.  Amniotic fluid is normal and good fetal activity is seen  .Antenatal testing is reassuring. BPP 8/8. ---------------------------------------------------------------------- Recommendations  -Continue weekly BPP till delivery. ----------------------------------------------------------------------                  Noralee Space, MD Electronically Signed Final Report   05/07/2021 11:40 am ----------------------------------------------------------------------  [place] MFM FETAL BPP WO NON STRESS  Result  Date: 04/30/2021 ----------------------------------------------------------------------  OBSTETRICS REPORT                       (Signed Final 04/30/2021 04:10 pm) ---------------------------------------------------------------------- Patient Info  ID #:       [id number]                          D.O.B.:  Sep 30, 2000 (20 yrs)  Name:       Michelle Knight               Visit Date: 04/30/2021 03:40 pm ---------------------------------------------------------------------- Performed By  Attending:        Ma Rings MD         Ref. Address:     [address], [place]  Performed By:     Clayton Lefort RDMS  [facility]  Referred By:      Everardo All ---------------------------------------------------------------------- Orders  #  Description                           Code        Ordered By  1  [place] MFM OB FOLLOW UP                   [phone]    RAVI SHANKAR  2  [place] MFM FETAL BPP WO NON               76819.01    UGONNA ANYANWU     STRESS ----------------------------------------------------------------------  #  Order #                     Accession #                Episode #  1  [id number]                   [id number]                 [id number]  2  [id number]                   [id number]                 [id number] ---------------------------------------------------------------------- Indications  Obesity complicating pregnancy, third          O99.213  trimester BMI 38  Poor obstetric history: Previous IUFD          O09.299  (stillbirth at 38 weeks)  [redacted] weeks gestation of pregnancy                Z3A.32  Poor obstetric history: Previous gestational   O09.299  diabetes  Anemia during pregnancy in third trimester      O99.013  LR NIPS (female)  Negative Horizon ---------------------------------------------------------------------- Fetal Evaluation  Num Of Fetuses:         1  Fetal Heart Rate(bpm):  187  Cardiac Activity:       Observed  Presentation:           Cephalic  Placenta:               Anterior  P.  Cord Insertion:      Previously Visualized  Amniotic Fluid  AFI FV:      Within normal limits  AFI Sum(cm)     %Tile       Largest Pocket(cm)  13.32           42          5.02  RUQ(cm)       RLQ(cm)       LUQ(cm)        LLQ(cm)  5.02          2.41          2.41           3.48 ---------------------------------------------------------------------- Biophysical Evaluation  Amniotic F.V:   Within normal limits       F. Tone:        Observed  F. Movement:    Observed                   Score:          8/8  F. Breathing:   Observed ---------------------------------------------------------------------- Biometry  BPD:      79.9  mm     G. Age:  32w 1d         24  %    CI:        70.77   %    70 - 86                                                          FL/HC:      19.1   %    19.9 - 21.5  HC:      302.7  mm     G. Age:  33w 4d         36  %    HC/AC:      1.05        0.96 - 1.11  AC:       288   mm     G. Age:  32w 6d         53  %    FL/BPD:     72.2   %    71 - 87  FL:       57.7  mm     G. Age:  30w 1d        1.5  %    FL/AC:      20.0   %    20 - 24  HUM:      50.8  mm     G. Age:  29w 5d        < 5  %  LV:        5.3  mm  Est. FW:    1894  gm      4 lb 3 oz     22  % ---------------------------------------------------------------------- OB History  Gravidity:    4         Term:   1         SAB:   2  Living:       0 ---------------------------------------------------------------------- Gestational Age  LMP:           32w 5d        Date:  09/13/20  EDD:   06/20/21  U/S Today:     32w 1d                                        EDD:   06/24/21  Best:          32w 5d     Det. By:  LMP  (09/13/20)          EDD:   06/20/21  ---------------------------------------------------------------------- Anatomy  Cranium:               Appears normal         LVOT:                   Previously seen  Cavum:                 Previously seen        Aortic Arch:            Appears normal  Ventricles:            Appears normal         Ductal Arch:            Previously seen  Choroid Plexus:        Previously seen        Diaphragm:              Previously seen  Cerebellum:            Previously seen        Stomach:                Appears normal, left                                                                        sided  Posterior Fossa:       Previously seen        Abdomen:                Previously seen  Nuchal Fold:           Previously seen        Abdominal Wall:         Previously seen  Face:                  Orbits and profile     Cord Vessels:           Previously seen                         previously seen  Lips:                  Previously seen        Kidneys:                Appear normal  Palate:                Previously seen        Bladder:                Appears normal  Thoracic:  Previously seen        Spine:                  Previously seen  Heart:                 Appears normal         Upper Extremities:      Previously seen                         (4CH, axis, and                         situs)  RVOT:                  Previously seen        Lower Extremities:      Previously seen  Other:  Fetus appears to be a female. Nasal bone and lenses previously          visualized. VC, 3VV previously visualized. Right Heel/feet and open          hands/left 5th digit previously visualized. ---------------------------------------------------------------------- Cervix Uterus Adnexa  Cervix  Not visualized (advanced GA >24wks)  Right Ovary  Visualized.  Left Ovary  Visualized. ---------------------------------------------------------------------- Comments  This patient was seen for a follow up growth scan and  biophysical profile due to  prior IUFD at 38 weeks.  She  denies any problems since her last exam and reports that she  has screened negative for gestational diabetes.  She was informed that the fetal growth and amniotic fluid  level appears appropriate for her gestational age.  A biophysical profile performed today was 8 out of 8.  We will continue to see her for weekly BPP's until delivery.  Due to her prior IUFD at 38 weeks, delivery may be  considered at between 37 to 38 weeks to avoid another  IUFD.  She will discuss the timing of delivery with you during  her future visits.  Another BPP was scheduled in 1 week. ----------------------------------------------------------------------                   Ma Rings, MD Electronically Signed Final Report   04/30/2021 04:10 pm ----------------------------------------------------------------------  [place] MFM OB FOLLOW UP  Result Date: 04/30/2021 ----------------------------------------------------------------------  OBSTETRICS REPORT                       (Signed Final 04/30/2021 04:10 pm) ---------------------------------------------------------------------- Patient Info  ID #:       [id number]                          D.O.B.:  2001-01-18 (20 yrs)  Name:       Michelle Knight               Visit Date: 04/30/2021 03:40 pm ---------------------------------------------------------------------- Performed By  Attending:        Ma Rings MD         Ref. Address:     [address], [place]  Performed By:     Clayton Lefort RDMS       [facility]  Referred By:      [facility] ---------------------------------------------------------------------- Orders  #  Description                            Code        Ordered By  1  [place] MFM OB FOLLOW UP                   [phone]    RAVI SHANKAR  2  [place] MFM FETAL BPP WO NON               76819.01    UGONNA ANYANWU     STRESS ----------------------------------------------------------------------  #  Order #                     Accession #                Episode #  1  [id number]                   [id number]                 [id number]  2  [id number]                   [id number]                 [id number] ---------------------------------------------------------------------- Indications  Obesity complicating pregnancy, third          O99.213  trimester BMI 38  Poor obstetric history: Previous IUFD          O09.299  (stillbirth at 38 weeks)  [redacted] weeks gestation of pregnancy                Z3A.32  Poor obstetric history: Previous gestational   O09.299  diabetes  Anemia during pregnancy in third trimester     O99.013  LR NIPS (female)  Negative Horizon ---------------------------------------------------------------------- Fetal Evaluation  Num Of Fetuses:         1  Fetal Heart Rate(bpm):  187  Cardiac Activity:       Observed  Presentation:           Cephalic  Placenta:  Anterior  P. Cord Insertion:      Previously Visualized  Amniotic Fluid  AFI FV:      Within normal limits  AFI Sum(cm)     %Tile       Largest Pocket(cm)  13.32           42          5.02  RUQ(cm)       RLQ(cm)       LUQ(cm)        LLQ(cm)  5.02          2.41          2.41           3.48 ---------------------------------------------------------------------- Biophysical Evaluation  Amniotic F.V:   Within normal limits       F. Tone:        Observed  F. Movement:    Observed                   Score:          8/8  F. Breathing:   Observed ---------------------------------------------------------------------- Biometry  BPD:      79.9  mm     G. Age:  32w 1d         24  %    CI:        70.77   %    70 - 86                                                          FL/HC:      19.1   %    19.9 - 21.5  HC:       302.7  mm     G. Age:  33w 4d         36  %    HC/AC:      1.05        0.96 - 1.11  AC:       288   mm     G. Age:  32w 6d         53  %    FL/BPD:     72.2   %    71 - 87  FL:       57.7  mm     G. Age:  30w 1d        1.5  %    FL/AC:      20.0   %    20 - 24  HUM:      50.8  mm     G. Age:  29w 5d        < 5  %  LV:        5.3  mm  Est. FW:    1894  gm      4 lb 3 oz     22  % ---------------------------------------------------------------------- OB History  Gravidity:    4         Term:   1         SAB:   2  Living:       0 ---------------------------------------------------------------------- Gestational Age  LMP:           32w 5d  Date:  09/13/20                 EDD:   06/20/21  U/S Today:     32w 1d                                        EDD:   06/24/21  Best:          32w 5d     Det. By:  LMP  (09/13/20)          EDD:   06/20/21 ---------------------------------------------------------------------- Anatomy  Cranium:               Appears normal         LVOT:                   Previously seen  Cavum:                 Previously seen        Aortic Arch:            Appears normal  Ventricles:            Appears normal         Ductal Arch:            Previously seen  Choroid Plexus:        Previously seen        Diaphragm:              Previously seen  Cerebellum:            Previously seen        Stomach:                Appears normal, left                                                                        sided  Posterior Fossa:       Previously seen        Abdomen:                Previously seen  Nuchal Fold:           Previously seen        Abdominal Wall:         Previously seen  Face:                  Orbits and profile     Cord Vessels:           Previously seen                         previously seen  Lips:                  Previously seen        Kidneys:                Appear normal  Palate:                Previously seen        Bladder:  Appears normal  Thoracic:               Previously seen        Spine:                  Previously seen  Heart:                 Appears normal         Upper Extremities:      Previously seen                         (4CH, axis, and                         situs)  RVOT:                  Previously seen        Lower Extremities:      Previously seen  Other:  Fetus appears to be a female. Nasal bone and lenses previously          visualized. VC, 3VV previously visualized. Right Heel/feet and open          hands/left 5th digit previously visualized. ---------------------------------------------------------------------- Cervix Uterus Adnexa  Cervix  Not visualized (advanced GA >24wks)  Right Ovary  Visualized.  Left Ovary  Visualized. ---------------------------------------------------------------------- Comments  This patient was seen for a follow up growth scan and  biophysical profile due to prior IUFD at 38 weeks.  She  denies any problems since her last exam and reports that she  has screened negative for gestational diabetes.  She was informed that the fetal growth and amniotic fluid  level appears appropriate for her gestational age.  A biophysical profile performed today was 8 out of 8.  We will continue to see her for weekly BPP's until delivery.  Due to her prior IUFD at 38 weeks, delivery may be  considered at between 37 to 38 weeks to avoid another  IUFD.  She will discuss the timing of delivery with you during  her future visits.  Another BPP was scheduled in 1 week. ----------------------------------------------------------------------                   Ma Rings, MD Electronically Signed Final Report   04/30/2021 04:10 pm ----------------------------------------------------------------------   Assessment and Plan:  Pregnancy: [id number] at [redacted]w[redacted]d 1. Maternal obesity affecting pregnancy, antepartum  2. Supervision of high risk pregnancy, antepartum - GBS/GC/CT nv - Up to date otherwise  3. History of IUFD - Discussed option for IOL at 37th  week. Discussed risk of IOL (very low) and discussed risk of NICU admission at 37w  (also low). We reviewed she would do next appt next week for GBS swab and at that point we would schedule so she will consider dates in the interim.  - Continue NST/BPP weekly - Growth is normal, 22%ile  Preterm labor symptoms and general obstetric precautions including but not limited to vaginal bleeding, contractions, leaking of fluid and fetal movement were reviewed in detail with the patient. I discussed the assessment and treatment plan with the patient. The patient was provided an opportunity to ask questions and all were answered. The patient agreed with the plan and demonstrated an understanding of the instructions. The patient was advised to call back or seek an in-person office evaluation/go to [facility] at [facility] for any urgent or concerning symptoms. Please  refer to After Visit Summary for other counseling recommendations.   I provided 14 minutes of face-to-face time during this encounter.  Return in about 1 week (around 05/21/2021) for gbs.  Future Appointments  Date Time Provider Department Center  05/15/2021 12:30 PM [facility] NURSE [facility]  05/15/2021 12:45 PM [facility] US4 [facility]  05/21/2021 10:45 AM [facility] NURSE [facility]  05/21/2021  1:30 PM [facility] US3 [facility]  05/27/2021  1:45 PM [facility] NURSE [facility]  05/27/2021  2:00 PM [facility] US1 [facility]  06/22/2021 10:30 AM [facility] LAB [facility] None  06/22/2021 10:45 AM Erenest Blank, NP [facility] None    Milas Hock, MD Center for [facility], [facility]

## 2021-05-15 ENCOUNTER — Ambulatory Visit: Payer: BC Managed Care – PPO | Admitting: *Deleted

## 2021-05-15 ENCOUNTER — Ambulatory Visit: Payer: BC Managed Care – PPO | Attending: Obstetrics

## 2021-05-15 ENCOUNTER — Encounter: Payer: Self-pay | Admitting: *Deleted

## 2021-05-15 VITALS — BP 120/60 | HR 66

## 2021-05-15 DIAGNOSIS — Z3A34 34 weeks gestation of pregnancy: Secondary | ICD-10-CM

## 2021-05-15 DIAGNOSIS — O09293 Supervision of pregnancy with other poor reproductive or obstetric history, third trimester: Secondary | ICD-10-CM | POA: Diagnosis not present

## 2021-05-15 DIAGNOSIS — Z8759 Personal history of other complications of pregnancy, childbirth and the puerperium: Secondary | ICD-10-CM | POA: Diagnosis not present

## 2021-05-15 DIAGNOSIS — O99210 Obesity complicating pregnancy, unspecified trimester: Secondary | ICD-10-CM | POA: Insufficient documentation

## 2021-05-15 DIAGNOSIS — Z8632 Personal history of gestational diabetes: Secondary | ICD-10-CM | POA: Diagnosis present

## 2021-05-17 NOTE — Progress Notes (Signed)
   PRENATAL VISIT NOTE  Subjective:  Michelle Knight is a 20 y.o. G4P1020 at [redacted]w[redacted]d being seen today for ongoing prenatal care.  She is currently monitored for the following issues for this high-risk pregnancy and has Supervision of high risk pregnancy, antepartum; History of IUFD; History of gestational diabetes in prior pregnancy, currently pregnant; History of anxiety; Depression; Maternal obesity affecting pregnancy, antepartum; and IDA (iron deficiency anemia) on their problem list.  Patient reports no complaints.  Contractions: Not present. Vag. Bleeding: None.  Movement: Present. Denies leaking of fluid.   The following portions of the patient's history were reviewed and updated as appropriate: allergies, current medications, past family history, past medical history, past social history, past surgical history and problem list.   Objective:   Vitals:   05/21/21 1451  BP: (!) 110/54  Pulse: 85  Weight: 178 lb (80.7 kg)    Fetal Status:     Movement: Present     General:  Alert, oriented and cooperative. Patient is in no acute distress.  Skin: Skin is warm and dry. No rash noted.   Cardiovascular: Normal heart rate noted  Respiratory: Normal respiratory effort, no problems with respiration noted  Abdomen: Soft, gravid, appropriate for gestational age.  Pain/Pressure: Absent     Pelvic: Cervical exam deferred        Extremities: Normal range of motion.  Edema: Trace  Mental Status: Normal mood and affect. Normal behavior. Normal judgment and thought content.   Assessment and Plan:  Pregnancy: G4P1020 at [redacted]w[redacted]d 1. History of IUFD - Discussed option for IOL at 37th week. Discussed risk of IOL (very low) and discussed risk of NICU admission at 37w  (also low). She would like 38w - desires 12/5.  - Continue NST/BPP weekly - Growth is normal, 22%ile  2. Supervision of high risk pregnancy, antepartum - GC/CT/GBS done today by self-swab. She declines internal exam.  - Desires  ppIUD  3. History of gestational diabetes in prior pregnancy, currently pregnant  4. Maternal obesity affecting pregnancy, antepartum  5. Iron deficiency anemia, unspecified iron deficiency anemia type - Anemia is c/w Iron deficiency.  - Taking PO   Preterm labor symptoms and general obstetric precautions including but not limited to vaginal bleeding, contractions, leaking of fluid and fetal movement were reviewed in detail with the patient. Please refer to After Visit Summary for other counseling recommendations.   No follow-ups on file.  Future Appointments  Date Time Provider Department Center  05/21/2021  4:10 PM Milas Hock, MD [facility]  05/27/2021  1:45 PM [facility] NURSE [facility]  05/27/2021  2:00 PM [facility] US1 [facility]  06/22/2021 10:30 AM [facility] None  06/22/2021 10:45 AM Erenest Blank, NP [facility] None    Milas Hock, MD

## 2021-05-21 ENCOUNTER — Encounter: Payer: Self-pay | Admitting: *Deleted

## 2021-05-21 ENCOUNTER — Other Ambulatory Visit: Payer: Self-pay

## 2021-05-21 ENCOUNTER — Ambulatory Visit (HOSPITAL_BASED_OUTPATIENT_CLINIC_OR_DEPARTMENT_OTHER): Payer: BC Managed Care – PPO

## 2021-05-21 ENCOUNTER — Encounter: Payer: Self-pay | Admitting: Obstetrics and Gynecology

## 2021-05-21 ENCOUNTER — Other Ambulatory Visit: Payer: Self-pay | Admitting: *Deleted

## 2021-05-21 ENCOUNTER — Ambulatory Visit: Payer: BC Managed Care – PPO | Admitting: *Deleted

## 2021-05-21 ENCOUNTER — Ambulatory Visit (INDEPENDENT_AMBULATORY_CARE_PROVIDER_SITE_OTHER): Payer: BC Managed Care – PPO | Admitting: Obstetrics and Gynecology

## 2021-05-21 ENCOUNTER — Other Ambulatory Visit (HOSPITAL_COMMUNITY)
Admission: RE | Admit: 2021-05-21 | Discharge: 2021-05-21 | Disposition: A | Discharge status: Home / Self Care | Payer: BC Managed Care – PPO | Source: Ambulatory Visit | Attending: Obstetrics and Gynecology | Admitting: Obstetrics and Gynecology

## 2021-05-21 VITALS — BP 110/54 | HR 85

## 2021-05-21 VITALS — BP 110/54 | HR 85 | Wt 178.0 lb

## 2021-05-21 DIAGNOSIS — O09293 Supervision of pregnancy with other poor reproductive or obstetric history, third trimester: Secondary | ICD-10-CM | POA: Insufficient documentation

## 2021-05-21 DIAGNOSIS — Z3A35 35 weeks gestation of pregnancy: Secondary | ICD-10-CM | POA: Diagnosis not present

## 2021-05-21 DIAGNOSIS — E669 Obesity, unspecified: Secondary | ICD-10-CM | POA: Diagnosis not present

## 2021-05-21 DIAGNOSIS — O99013 Anemia complicating pregnancy, third trimester: Secondary | ICD-10-CM

## 2021-05-21 DIAGNOSIS — Z8632 Personal history of gestational diabetes: Secondary | ICD-10-CM

## 2021-05-21 DIAGNOSIS — O99213 Obesity complicating pregnancy, third trimester: Secondary | ICD-10-CM | POA: Diagnosis not present

## 2021-05-21 DIAGNOSIS — O99210 Obesity complicating pregnancy, unspecified trimester: Secondary | ICD-10-CM

## 2021-05-21 DIAGNOSIS — Z8759 Personal history of other complications of pregnancy, childbirth and the puerperium: Secondary | ICD-10-CM | POA: Diagnosis not present

## 2021-05-21 DIAGNOSIS — D509 Iron deficiency anemia, unspecified: Secondary | ICD-10-CM

## 2021-05-21 DIAGNOSIS — O0990 Supervision of high risk pregnancy, unspecified, unspecified trimester: Secondary | ICD-10-CM | POA: Insufficient documentation

## 2021-05-21 DIAGNOSIS — O09299 Supervision of pregnancy with other poor reproductive or obstetric history, unspecified trimester: Secondary | ICD-10-CM

## 2021-05-21 LAB — OB RESULTS CONSOLE GBS: GBS: NEGATIVE

## 2021-05-21 LAB — OB RESULTS CONSOLE GC/CHLAMYDIA: Gonorrhea: NEGATIVE

## 2021-05-22 LAB — CERVICOVAGINAL ANCILLARY ONLY
Chlamydia: NEGATIVE
Comment: NEGATIVE
Comment: NORMAL
Neisseria Gonorrhea: NEGATIVE

## 2021-05-24 ENCOUNTER — Other Ambulatory Visit: Payer: Self-pay | Admitting: Advanced Practice Midwife

## 2021-05-24 LAB — CULTURE, BETA STREP (GROUP B ONLY)
MICRO NUMBER:: 12654787
SPECIMEN QUALITY:: ADEQUATE

## 2021-05-27 ENCOUNTER — Encounter: Payer: Self-pay | Admitting: *Deleted

## 2021-05-27 ENCOUNTER — Ambulatory Visit: Payer: BC Managed Care – PPO | Attending: Obstetrics

## 2021-05-27 ENCOUNTER — Other Ambulatory Visit: Payer: Self-pay

## 2021-05-27 ENCOUNTER — Telehealth: Payer: Self-pay | Admitting: *Deleted

## 2021-05-27 ENCOUNTER — Ambulatory Visit: Payer: BC Managed Care – PPO | Admitting: *Deleted

## 2021-05-27 ENCOUNTER — Encounter: Payer: Self-pay | Admitting: Obstetrics and Gynecology

## 2021-05-27 VITALS — BP 109/56 | HR 80

## 2021-05-27 DIAGNOSIS — Z3A33 33 weeks gestation of pregnancy: Secondary | ICD-10-CM | POA: Diagnosis not present

## 2021-05-27 DIAGNOSIS — O99213 Obesity complicating pregnancy, third trimester: Secondary | ICD-10-CM | POA: Diagnosis not present

## 2021-05-27 DIAGNOSIS — O99210 Obesity complicating pregnancy, unspecified trimester: Secondary | ICD-10-CM | POA: Diagnosis present

## 2021-05-27 DIAGNOSIS — Z8632 Personal history of gestational diabetes: Secondary | ICD-10-CM | POA: Diagnosis present

## 2021-05-27 DIAGNOSIS — Z8759 Personal history of other complications of pregnancy, childbirth and the puerperium: Secondary | ICD-10-CM

## 2021-05-27 DIAGNOSIS — O09293 Supervision of pregnancy with other poor reproductive or obstetric history, third trimester: Secondary | ICD-10-CM | POA: Diagnosis present

## 2021-05-27 DIAGNOSIS — O99013 Anemia complicating pregnancy, third trimester: Secondary | ICD-10-CM | POA: Diagnosis not present

## 2021-05-27 DIAGNOSIS — E669 Obesity, unspecified: Secondary | ICD-10-CM | POA: Diagnosis not present

## 2021-05-27 DIAGNOSIS — Z3A36 36 weeks gestation of pregnancy: Secondary | ICD-10-CM

## 2021-05-27 NOTE — Telephone Encounter (Signed)
Pt called today stating that her IOL is scheduled for 11/26.  She stated that she and Dr Para March discuss being induced @ 37 weeks but pt discussed and MD decided that she could wait until 06/08/21.  Pt had appt today for BPP @ [facility].  I told her to talk with them and if they were agreeing to the 06/08/21 IOL we would change it.  I spoke with Lattie Corns on L&D and changed the IOL from 11/26 to 06/08/21.  Pt know that she will wait for L&D to call her to come in to get her induction started.

## 2021-05-27 NOTE — Telephone Encounter (Signed)
Pt sent a message through my chart that she and Dr Para March had discussed her IOL to be scheduled for 06/08/21.  The IOL formed that was scanned shows the IOL is for 11/27.  Dr Lianne Bushy note does say that per MFM recommendation her IOL should be 37 weeks but she does mention that the pt would like to be induced on 06/08/21.  Dr Para March is on PAL this week.  Pt is scheduled for BPP today.  I have ask her to discuss with [facility] about the dates.  If they are OK with switching it to 06/08/21, they could call L and D to change.  Pt agrees with the plan.

## 2021-05-30 ENCOUNTER — Inpatient Hospital Stay (HOSPITAL_COMMUNITY): Payer: BC Managed Care – PPO

## 2021-05-30 ENCOUNTER — Other Ambulatory Visit: Payer: Self-pay | Admitting: Advanced Practice Midwife

## 2021-05-30 ENCOUNTER — Inpatient Hospital Stay (HOSPITAL_COMMUNITY)
Admission: AD | Admit: 2021-05-30 | Payer: BC Managed Care – PPO | Source: Home / Self Care | Admitting: Obstetrics and Gynecology

## 2021-06-02 ENCOUNTER — Other Ambulatory Visit: Payer: Self-pay

## 2021-06-02 ENCOUNTER — Encounter: Payer: Self-pay | Admitting: Advanced Practice Midwife

## 2021-06-02 ENCOUNTER — Ambulatory Visit (INDEPENDENT_AMBULATORY_CARE_PROVIDER_SITE_OTHER): Payer: BC Managed Care – PPO | Admitting: Advanced Practice Midwife

## 2021-06-02 VITALS — BP 107/63 | HR 70 | Wt 180.0 lb

## 2021-06-02 DIAGNOSIS — D509 Iron deficiency anemia, unspecified: Secondary | ICD-10-CM

## 2021-06-02 DIAGNOSIS — Z3A37 37 weeks gestation of pregnancy: Secondary | ICD-10-CM

## 2021-06-02 DIAGNOSIS — O0990 Supervision of high risk pregnancy, unspecified, unspecified trimester: Secondary | ICD-10-CM

## 2021-06-02 DIAGNOSIS — Z8759 Personal history of other complications of pregnancy, childbirth and the puerperium: Secondary | ICD-10-CM | POA: Diagnosis not present

## 2021-06-02 NOTE — Progress Notes (Addendum)
   PRENATAL VISIT NOTE  Subjective:  Michelle Knight is a 20 y.o. G4P1020 at [redacted]w[redacted]d being seen today for ongoing prenatal care.  She is currently monitored for the following issues for this high-risk pregnancy and has Supervision of high risk pregnancy, antepartum; History of IUFD; History of gestational diabetes in prior pregnancy, currently pregnant; History of anxiety; Depression; Maternal obesity affecting pregnancy, antepartum; and IDA (iron deficiency anemia) on their problem list.  Patient reports occasional contractions.  Contractions: Not present. Vag. Bleeding: None.  Movement: Present. Denies leaking of fluid.   The following portions of the patient's history were reviewed and updated as appropriate: allergies, current medications, past family history, past medical history, past social history, past surgical history and problem list.   Objective:   Vitals:   06/02/21 1018  BP: 107/63  Pulse: 70  Weight: 180 lb (81.6 kg)    Fetal Status:     Movement: Present     General:  Alert, oriented and cooperative. Patient is in no acute distress.  Skin: Skin is warm and dry. No rash noted.   Cardiovascular: Normal heart rate noted  Respiratory: Normal respiratory effort, no problems with respiration noted  Abdomen: Soft, gravid, appropriate for gestational age.  Pain/Pressure: Absent     Pelvic: Cervical exam deferred        Extremities: Normal range of motion.  Edema: Trace  Mental Status: Normal mood and affect. Normal behavior. Normal judgment and thought content.   Assessment and Plan:  Pregnancy: G4P1020 at [redacted]w[redacted]d 1. Supervision of high risk pregnancy, antepartum --Anticipatory guidance about next visits/weeks of pregnancy given. --Next appt for NST/AFI on Friday --IOL 06/08/21  2. Iron deficiency anemia, unspecified iron deficiency anemia type --On oral iron. Hgb 11.4 on 10/31.  3. History of IUFD --At 38 weeks, MFM recommended IOL at 37 weeks, pt declined, scheduled at  38 weeks.   --NST twice this week with AFI for modified BPP  --NST reactive today, pt with good fetal movement  4. [redacted] weeks gestation of pregnancy   Term labor symptoms and general obstetric precautions including but not limited to vaginal bleeding, contractions, leaking of fluid and fetal movement were reviewed in detail with the patient. Please refer to After Visit Summary for other counseling recommendations.   No follow-ups on file.  Future Appointments  Date Time Provider Department Center  06/05/2021  9:30 AM [facility] NURSE [facility]  06/08/2021  7:15 AM [facility] SCHED ROOM [facility] None  06/22/2021 10:30 AM [facility] LAB [facility] None  06/22/2021 10:45 AM Erenest Blank, NP [facility] None    Sharen Counter, CNM

## 2021-06-03 NOTE — Progress Notes (Signed)
NST reactive and reviewed by l Leftwich-Kirby, CNM

## 2021-06-05 ENCOUNTER — Other Ambulatory Visit: Payer: Self-pay

## 2021-06-05 ENCOUNTER — Ambulatory Visit (INDEPENDENT_AMBULATORY_CARE_PROVIDER_SITE_OTHER): Payer: BC Managed Care – PPO | Admitting: *Deleted

## 2021-06-05 VITALS — BP 106/60 | HR 61 | Wt 181.0 lb

## 2021-06-05 DIAGNOSIS — O99213 Obesity complicating pregnancy, third trimester: Secondary | ICD-10-CM | POA: Diagnosis not present

## 2021-06-05 DIAGNOSIS — Z3A37 37 weeks gestation of pregnancy: Secondary | ICD-10-CM

## 2021-06-05 DIAGNOSIS — O99210 Obesity complicating pregnancy, unspecified trimester: Secondary | ICD-10-CM

## 2021-06-05 NOTE — Progress Notes (Signed)
NST reactive and AFI normal limits.  IOL scheduled for 06/08/21.  S&S reviewed with pt if she needs to go to L&D.

## 2021-06-08 ENCOUNTER — Inpatient Hospital Stay (HOSPITAL_COMMUNITY)
Admission: AD | Admit: 2021-06-08 | Discharge: 2021-06-11 | DRG: 807 | Disposition: A | Discharge status: Home / Self Care | Payer: BC Managed Care – PPO | Attending: Obstetrics and Gynecology | Admitting: Obstetrics and Gynecology

## 2021-06-08 ENCOUNTER — Encounter: Payer: Self-pay | Admitting: Family

## 2021-06-08 ENCOUNTER — Encounter (HOSPITAL_COMMUNITY): Payer: Self-pay | Admitting: Family Medicine

## 2021-06-08 ENCOUNTER — Inpatient Hospital Stay (HOSPITAL_COMMUNITY): Payer: BC Managed Care – PPO

## 2021-06-08 ENCOUNTER — Inpatient Hospital Stay (HOSPITAL_COMMUNITY): Payer: BC Managed Care – PPO | Admitting: Anesthesiology

## 2021-06-08 ENCOUNTER — Other Ambulatory Visit: Payer: Self-pay

## 2021-06-08 DIAGNOSIS — O09293 Supervision of pregnancy with other poor reproductive or obstetric history, third trimester: Secondary | ICD-10-CM | POA: Diagnosis not present

## 2021-06-08 DIAGNOSIS — O0990 Supervision of high risk pregnancy, unspecified, unspecified trimester: Secondary | ICD-10-CM

## 2021-06-08 DIAGNOSIS — Z7982 Long term (current) use of aspirin: Secondary | ICD-10-CM

## 2021-06-08 DIAGNOSIS — O9902 Anemia complicating childbirth: Principal | ICD-10-CM | POA: Diagnosis present

## 2021-06-08 DIAGNOSIS — Z8659 Personal history of other mental and behavioral disorders: Secondary | ICD-10-CM

## 2021-06-08 DIAGNOSIS — O99214 Obesity complicating childbirth: Secondary | ICD-10-CM | POA: Diagnosis present

## 2021-06-08 DIAGNOSIS — O26893 Other specified pregnancy related conditions, third trimester: Secondary | ICD-10-CM | POA: Diagnosis not present

## 2021-06-08 DIAGNOSIS — Z8759 Personal history of other complications of pregnancy, childbirth and the puerperium: Secondary | ICD-10-CM

## 2021-06-08 DIAGNOSIS — Z20822 Contact with and (suspected) exposure to covid-19: Secondary | ICD-10-CM | POA: Diagnosis present

## 2021-06-08 DIAGNOSIS — D509 Iron deficiency anemia, unspecified: Secondary | ICD-10-CM | POA: Diagnosis not present

## 2021-06-08 DIAGNOSIS — Z30430 Encounter for insertion of intrauterine contraceptive device: Secondary | ICD-10-CM | POA: Diagnosis not present

## 2021-06-08 DIAGNOSIS — Z3A38 38 weeks gestation of pregnancy: Secondary | ICD-10-CM | POA: Diagnosis not present

## 2021-06-08 DIAGNOSIS — O99210 Obesity complicating pregnancy, unspecified trimester: Secondary | ICD-10-CM

## 2021-06-08 DIAGNOSIS — Z30014 Encounter for initial prescription of intrauterine contraceptive device: Secondary | ICD-10-CM | POA: Diagnosis not present

## 2021-06-08 DIAGNOSIS — Z8632 Personal history of gestational diabetes: Secondary | ICD-10-CM | POA: Diagnosis not present

## 2021-06-08 LAB — CBC
HCT: 36 % (ref 36.0–46.0)
Hemoglobin: 11.8 g/dL — ABNORMAL LOW (ref 12.0–15.0)
MCH: 26.9 pg (ref 26.0–34.0)
MCHC: 32.8 g/dL (ref 30.0–36.0)
MCV: 82.2 fL (ref 80.0–100.0)
Platelets: 218 10*3/uL (ref 150–400)
RBC: 4.38 MIL/uL (ref 3.87–5.11)
RDW: 14.7 % (ref 11.5–15.5)
WBC: 8 10*3/uL (ref 4.0–10.5)
nRBC: 0 % (ref 0.0–0.2)

## 2021-06-08 LAB — RESP PANEL BY RT-PCR (FLU A&B, COVID) ARPGX2
Influenza A by PCR: NEGATIVE
Influenza B by PCR: NEGATIVE
SARS Coronavirus 2 by RT PCR: NEGATIVE

## 2021-06-08 LAB — TYPE AND SCREEN
ABO/RH(D): B POS
Antibody Screen: NEGATIVE

## 2021-06-08 MED ORDER — MISOPROSTOL 25 MCG QUARTER TABLET
25.0000 ug | ORAL_TABLET | ORAL | Status: DC | PRN
  Administered 2021-06-08: 25 ug via VAGINAL
  Filled 2021-06-08: qty 1

## 2021-06-08 MED ORDER — FENTANYL CITRATE (PF) 100 MCG/2ML IJ SOLN
100.0000 ug | INTRAMUSCULAR | Status: DC | PRN
  Administered 2021-06-08: 100 ug via INTRAVENOUS
  Filled 2021-06-08: qty 2

## 2021-06-08 MED ORDER — LACTATED RINGERS IV SOLN: 500.0000 mL | INTRAVENOUS | Status: DC | PRN

## 2021-06-08 MED ORDER — ONDANSETRON HCL 4 MG/2ML IJ SOLN: 4.0000 mg | Freq: Four times a day (QID) | INTRAMUSCULAR | Status: DC | PRN

## 2021-06-08 MED ORDER — EPHEDRINE 5 MG/ML INJ: 10.0000 mg | INTRAVENOUS | Status: DC | PRN

## 2021-06-08 MED ORDER — LACTATED RINGERS IV SOLN
500.0000 mL | Freq: Once | INTRAVENOUS | Status: AC
  Administered 2021-06-08: 500 mL via INTRAVENOUS

## 2021-06-08 MED ORDER — DIPHENHYDRAMINE HCL 50 MG/ML IJ SOLN: 12.5000 mg | INTRAMUSCULAR | Status: DC | PRN

## 2021-06-08 MED ORDER — ACETAMINOPHEN 325 MG PO TABS: 650.0000 mg | ORAL_TABLET | ORAL | Status: DC | PRN

## 2021-06-08 MED ORDER — OXYTOCIN-SODIUM CHLORIDE 30-0.9 UT/500ML-% IV SOLN
1.0000 m[IU]/min | INTRAVENOUS | Status: DC
  Administered 2021-06-08: 2 m[IU]/min via INTRAVENOUS

## 2021-06-08 MED ORDER — LEVONORGESTREL 20.1 MCG/DAY IU IUD
1.0000 | INTRAUTERINE_SYSTEM | Freq: Once | INTRAUTERINE | Status: AC
  Administered 2021-06-09: 1 via INTRAUTERINE
  Filled 2021-06-08: qty 1

## 2021-06-08 MED ORDER — LIDOCAINE-EPINEPHRINE (PF) 2 %-1:200000 IJ SOLN
INTRAMUSCULAR | Status: DC | PRN
  Administered 2021-06-08: 4 mL via EPIDURAL

## 2021-06-08 MED ORDER — SOD CITRATE-CITRIC ACID 500-334 MG/5ML PO SOLN: 30.0000 mL | ORAL | Status: DC | PRN

## 2021-06-08 MED ORDER — FENTANYL-BUPIVACAINE-NACL 0.5-0.125-0.9 MG/250ML-% EP SOLN
12.0000 mL/h | EPIDURAL | Status: DC | PRN
  Administered 2021-06-08: 12 mL/h via EPIDURAL
  Filled 2021-06-08: qty 250

## 2021-06-08 MED ORDER — OXYTOCIN-SODIUM CHLORIDE 30-0.9 UT/500ML-% IV SOLN
2.5000 [IU]/h | INTRAVENOUS | Status: DC
  Filled 2021-06-08: qty 500

## 2021-06-08 MED ORDER — PHENYLEPHRINE 40 MCG/ML (10ML) SYRINGE FOR IV PUSH (FOR BLOOD PRESSURE SUPPORT): 80.0000 ug | PREFILLED_SYRINGE | INTRAVENOUS | Status: DC | PRN

## 2021-06-08 MED ORDER — TERBUTALINE SULFATE 1 MG/ML IJ SOLN: 0.2500 mg | Freq: Once | INTRAMUSCULAR | Status: DC | PRN

## 2021-06-08 MED ORDER — LACTATED RINGERS IV SOLN: INTRAVENOUS | Status: DC

## 2021-06-08 MED ORDER — LIDOCAINE HCL (PF) 1 % IJ SOLN: 30.0000 mL | INTRAMUSCULAR | Status: DC | PRN

## 2021-06-08 MED ORDER — OXYTOCIN BOLUS FROM INFUSION
333.0000 mL | Freq: Once | INTRAVENOUS | Status: AC
  Administered 2021-06-09: 333 mL via INTRAVENOUS

## 2021-06-08 NOTE — Anesthesia Procedure Notes (Signed)
Epidural Patient location during procedure: OB Start time: 06/08/2021 4:05 PM End time: 06/08/2021 4:15 PM  Staffing Anesthesiologist: Elmer Picker, MD Performed: anesthesiologist   Preanesthetic Checklist Completed: patient identified, IV checked, risks and benefits discussed, monitors and equipment checked, pre-op evaluation and timeout performed  Epidural Patient position: sitting Prep: DuraPrep and site prepped and draped Patient monitoring: continuous pulse ox, blood pressure, heart rate and cardiac monitor Approach: midline Location: L3-L4 Injection technique: LOR air  Needle:  Needle type: Tuohy  Needle gauge: 17 G Needle length: 9 cm Needle insertion depth: 6 cm Catheter type: closed end flexible Catheter size: 19 Gauge Catheter at skin depth: 11 cm Test dose: negative  Assessment Sensory level: T8 Events: blood not aspirated, injection not painful, no injection resistance, no paresthesia and negative IV test  Additional Notes Patient identified. Risks/Benefits/Options discussed with patient including but not limited to bleeding, infection, nerve damage, paralysis, failed block, incomplete pain control, headache, blood pressure changes, nausea, vomiting, reactions to medication both or allergic, itching and postpartum back pain. Confirmed with bedside nurse the patient's most recent platelet count. Confirmed with patient that they are not currently taking any anticoagulation, have any bleeding history or any family history of bleeding disorders. Patient expressed understanding and wished to proceed. All questions were answered. Sterile technique was used throughout the entire procedure. Please see nursing notes for vital signs. Test dose was given through epidural catheter and negative prior to continuing to dose epidural or start infusion. Warning signs of high block given to the patient including shortness of breath, tingling/numbness in hands, complete motor block,  or any concerning symptoms with instructions to call for help. Patient was given instructions on fall risk and not to get out of bed. All questions and concerns addressed with instructions to call with any issues or inadequate analgesia.  Reason for block:procedure for pain

## 2021-06-08 NOTE — Anesthesia Preprocedure Evaluation (Addendum)
Anesthesia Evaluation  Patient identified by MRN, date of birth, ID band Patient awake    Reviewed: Allergy & Precautions, NPO status , Patient's Chart, lab work & pertinent test results  Airway Mallampati: II  TM Distance: >3 FB Neck ROM: Full    Dental no notable dental hx.    Pulmonary neg pulmonary ROS,    Pulmonary exam normal breath sounds clear to auscultation       Cardiovascular negative cardio ROS Normal cardiovascular exam Rhythm:Regular Rate:Normal     Neuro/Psych PSYCHIATRIC DISORDERS Depression negative neurological ROS     GI/Hepatic negative GI ROS, Neg liver ROS,   Endo/Other  Morbid obesity (BMI 40)  Renal/GU negative Renal ROS  negative genitourinary   Musculoskeletal negative musculoskeletal ROS (+)   Abdominal   Peds  Hematology negative hematology ROS (+)   Anesthesia Other Findings IOL for h/o IUFD at 38 weeks  Reproductive/Obstetrics (+) Pregnancy                            Anesthesia Physical Anesthesia Plan  ASA: 3  Anesthesia Plan: Epidural   Post-op Pain Management:    Induction:   PONV Risk Score and Plan: Treatment may vary due to age or medical condition  Airway Management Planned: Natural Airway  Additional Equipment:   Intra-op Plan:   Post-operative Plan:   Informed Consent: I have reviewed the patients History and Physical, chart, labs and discussed the procedure including the risks, benefits and alternatives for the proposed anesthesia with the patient or authorized representative who has indicated his/her understanding and acceptance.       Plan Discussed with: Anesthesiologist  Anesthesia Plan Comments: (Patient identified. Risks, benefits, options discussed with patient including but not limited to bleeding, infection, nerve damage, paralysis, failed block, incomplete pain control, headache, blood pressure changes, nausea, vomiting,  reactions to medication, itching, and post partum back pain. Confirmed with bedside nurse the patient's most recent platelet count. Confirmed with the patient that they are not taking any anticoagulation, have any bleeding history or any family history of bleeding disorders. Patient expressed understanding and wishes to proceed. All questions were answered. )        Anesthesia Quick Evaluation

## 2021-06-08 NOTE — Progress Notes (Signed)
Michelle Knight is a 20 y.o. G4P1020 at [redacted]w[redacted]d by LMP admitted for induction of labor due to hx of fetal demise with previous pregnancy at 38 weeks.  Subjective: Patient doing well. Feeling contractions. Did receive 1 dose of fentanyl and was able to get some rest.   Objective: BP 112/68 (BP Location: Left Arm)   Pulse (!) 58   Temp 98.2 F (36.8 C) (Oral)   Resp 16   Ht 4\' 9"  (1.448 m)   Wt 83.1 kg   LMP 09/13/2020   BMI 39.64 kg/m  No intake/output data recorded. No intake/output data recorded.  FHT:  FHR: 130 bpm, variability: moderate,  accelerations:  Present,  decelerations:  Absent UC:   regular, every 3-5 minutes SVE:   Dilation: 1.5 Effacement (%): 50 Station: -2 Exam by:: S Nix RN  Labs: Lab Results  Component Value Date   WBC 8.0 06/08/2021   HGB 11.8 (L) 06/08/2021   HCT 36.0 06/08/2021   MCV 82.2 06/08/2021   PLT 218 06/08/2021   Assessment / Plan: Michelle Knight is a 20 y.o. G4P1020 at [redacted]w[redacted]d here for IOL 2/2 to history of IUFD @ 38 weeks (2018).   Induction of Labor:  FB still in place. Will give second dose of cytotec. Can consider AROM once FB is out.   Fetal Wellbeing:  Category I Pain Control:  Epidural and IV pain meds on request I/D:  GBS neg Anticipated MOD:  NSVD  08-02-1978 M.D. PGY-2 06/08/2021, 2:38 PM

## 2021-06-08 NOTE — Progress Notes (Signed)
Michelle Knight is a 20 y.o. G4P1020 at [redacted]w[redacted]d by LMP admitted for induction of labor due to hx of fetal demise with previous pregnancy at 38 weeks.  Subjective: Patient doing well. Epidural in place, not feeling contractions or pressure. No complaints  Objective: BP (!) 110/57   Pulse 72   Temp 98.2 F (36.8 C) (Oral)   Resp 16   Ht 4\' 9"  (1.448 m)   Wt 83.1 kg   LMP 09/13/2020   SpO2 100%   BMI 39.64 kg/m  No intake/output data recorded. No intake/output data recorded.  FHT:  FHR: 125 bpm, variability: moderate,  accelerations:  Present,  decelerations:  Absent UC:   irregular, every 7-9 min SVE:   Dilation: 6 Effacement (%): 90 Station: -1 Exam by:: Dr. [email or web address]  Labs: Lab Results  Component Value Date   WBC 8.0 06/08/2021   HGB 11.8 (L) 06/08/2021   HCT 36.0 06/08/2021   MCV 82.2 06/08/2021   PLT 218 06/08/2021   Assessment / Plan: Michelle Knight is a 20 y.o. G4P1020 at [redacted]w[redacted]d here for IOL 2/2 to history of IUFD @ 38 weeks (2018).   Induction of Labor: AROM clear fluid @ 1750. Contraction pattern has spaced out, will start pit and titrate to complete Fetal Wellbeing:  Category I Pain Control:  Epidural I/D:  GBS neg Anticipated MOD:  NSVD  08-02-1978 M.D. PGY-2 06/08/2021, 6:01 PM

## 2021-06-08 NOTE — Progress Notes (Signed)
Patient ID: Michelle Knight, female   DOB: 2000/11/13, 20 y.o.   MRN: [id number]  Ms. [name] is a 20 y.o. G11P1020 female at [redacted]w[redacted]d weeks gestation here for IOL for h/o term IUFD. L&D provider was requested by RN to verify presentation.  NST - FHR: 135 bpm / moderate variability / accels present / decels absent / TOCO: none  Patient informed that the ultrasound is considered a limited OB ultrasound and is not intended to be a complete ultrasound exam.  Patient also informed that the ultrasound is not being completed with the intent of assessing for fetal or placental anomalies or any pelvic abnormalities.  Explained that the purpose of today's ultrasound is to assess for presentation.  Baby was found to be in a cephalic (ROT) presentation. Patient acknowledges the purpose of the exam and the limitations of the study.  [name], CNM  06/08/2021 11:09 AM

## 2021-06-08 NOTE — H&P (Addendum)
OBSTETRIC ADMISSION HISTORY AND PHYSICAL  Michelle Knight is a 20 y.o. female [phone] with IUP at [redacted]w[redacted]d by LMP c/w 14 week ultrasound presenting for IOL 2/2 to history of IUFD @ 38 weeks (2018).  She reports +FMs, No LOF, no VB, no blurry vision, headaches or peripheral edema, and RUQ pain.  She plans on bottle feeding. She request ppIUD for birth control. She received her prenatal care at [facility]    Dating: By LMP c/w 14 week u/s --->  Estimated Date of Delivery: 06/20/21  Sono:  05/27/21 @[redacted]w[redacted]d , CWD, normal anatomy, cephalic presentation, 2751g, EFW  Prenatal History/Complications:  IUFD at 38 weeks (April 2018): taking ASA daily Iron Deficiency Anemia: taking iron supplementation daily Obesity: Pre-G BMI 38.5, taking ASA daily History of Gestational diabetes with prior pregnancy with normal screening this pregnancy.   Past Medical History: Past Medical History:  Diagnosis Date   Anemia    IUFD at 20 weeks or more of gestation     Past Surgical History: Past Surgical History:  Procedure Laterality Date   WISDOM TOOTH EXTRACTION      Obstetrical History: OB History     Gravida  4   Para  1   Term  1   Preterm      AB  2   Living  0      SAB  2   IAB      Ectopic      Multiple      Live Births  0           Social History Social History   Socioeconomic History   Marital status: Single    Spouse name: Not on file   Number of children: Not on file   Years of education: Not on file   Highest education level: Not on file  Occupational History   Not on file  Tobacco Use   Smoking status: Never   Smokeless tobacco: Never  Vaping Use   Vaping Use: Never used  Substance and Sexual Activity   Alcohol use: Not Currently   Drug use: Not Currently    Types: Marijuana    Comment: last use oct 2021   Sexual activity: Yes    Birth control/protection: None  Other Topics Concern   Not on file  Social History Narrative   Not on file    Social Determinants of Health   Financial Resource Strain: Not on file  Food Insecurity: Not on file  Transportation Needs: Not on file  Physical Activity: Not on file  Stress: Not on file  Social Connections: Not on file   Family History: Family History  Adopted: Yes   Allergies: Allergies  Allergen Reactions   Other Itching, Swelling and Other (See Comments)    Certain exotic fruits...mango, dragonfruit, papaya,etc. Tongue swells.   Mixed Grasses Itching   Tramadol Hives    Medications Prior to Admission  Medication Sig Dispense Refill Last Dose   aspirin EC 81 MG tablet Take 81 mg by mouth daily. Swallow whole.   06/07/2021   Fe Bisgly-Vit C-Vit B12-FA (GENTLE IRON PO) Take by mouth.   06/07/2021   Prenatal Vit-Fe Fumarate-FA (PRENATAL VITAMINS PO) Take by mouth.   06/07/2021     Review of Systems   All systems reviewed and negative except as stated in HPI  Blood pressure 122/73, pulse (!) 107, resp. rate 14, height 4\' 9"  (1.448 m), weight 83.1 kg, last menstrual period 09/13/2020. General appearance: alert and cooperative  Lungs: clear to auscultation bilaterally Heart: regular rate and rhythm Abdomen: soft, non-tender; bowel sounds normal Extremities: Homans sign is negative, no sign of DVT Presentation: cephalic based on bedside ultrasound done by Akyla Vavrek Dawsone CNM Fetal monitoringBaseline: 130 bpm Uterine activity None Dilation: 1.5 Effacement (%): 50 Station: -2 Exam by:: S Nix RN  Prenatal labs: ABO, Rh: --/--/B POS (12/05 6808) Antibody: NEG (12/05 8110) Rubella: 8.69 (05/10 0000) IMMUNE RPR: NON-REACTIVE (10/17 0900)  HBsAg: NON-REACTIVE (05/10 0000)  HIV: NON-REACTIVE (10/17 0900)  GBS: Negative/-- (11/17 0000)  2 hr Glucola normal Genetic screening low risk Anatomy US normal Prenatal Transfer Tool  Maternal Diabetes: No Genetic Screening: Normal Maternal Ultrasounds/Referrals: Normal Fetal Ultrasounds or other Referrals:  Referred to  Materal Fetal Medicine  with normal ultrasounds and BPP. Maternal Substance Abuse:  No Significant Maternal Medications:  Meds include: Other:  ASA and Iron Significant Maternal Lab Results: None  Results for orders placed or performed during the hospital encounter of 06/08/21 (from the past 24 hour(s))  CBC   Collection Time: 06/08/21  9:49 AM  Result Value Ref Range   WBC 8.0 4.0 - 10.5 K/uL   RBC 4.38 3.87 - 5.11 MIL/uL   Hemoglobin 11.8 (L) 12.0 - 15.0 g/dL   HCT 31.5 94.5 - 85.9 %   MCV 82.2 80.0 - 100.0 fL   MCH 26.9 26.0 - 34.0 pg   MCHC 32.8 30.0 - 36.0 g/dL   RDW 29.2 44.6 - 28.6 %   Platelets 218 150 - 400 K/uL   nRBC 0.0 0.0 - 0.2 %  Type and screen   Collection Time: 06/08/21  9:51 AM  Result Value Ref Range   ABO/RH(D) B POS    Antibody Screen NEG    Sample Expiration      06/11/2021,2359 Performed at [facility], [address], [place], [place] [address]     Patient Active Problem List   Diagnosis Date Noted   IDA (iron deficiency anemia) 05/05/2021   Maternal obesity affecting pregnancy, antepartum 02/05/2021   History of IUFD 11/11/2020   History of gestational diabetes in prior pregnancy, currently pregnant 11/11/2020   History of anxiety 11/11/2020   Depression 11/11/2020   Supervision of high risk pregnancy, antepartum 11/10/2020   Assessment/Plan:  Michelle Knight is a 20 y.o. G4P1020 at [redacted]w[redacted]d here for IOL 2/2 to history of IUFD @ 38 weeks (2018).   #IOL: Induction started with cytotec and FB placement. Will redose cytotec at 1400 if FB still in place.   #History of IUFD @ 38 wks  #History of gestational diabetes in prior pregnancy -EFW 31% and normal screening this pregnancy.   #Maternal obesity affecting pregnancy, antepartum -Taking ASA daily   #Iron deficiency anemia, unspecified iron deficiency anemia type - Taking PO  - Admission Hgb 11.8  #Pain: Epidural on request #FWB: Category 1 #ID:  GBS negative #MOF:  Bottle #MOC:pp IUD, order has been placed #Circ:  Yes  Trula Slade, MD PGY-2 06/08/2021, 11:30 AM    Attestation of Supervision of Student:  I confirm that I have verified the information documented in the  resident 's note and that I have also personally reperformed the history, physical exam and all medical decision making activities.  I have verified that all services and findings are accurately documented in this student's note; and I agree with management and plan as outlined in the documentation. I have also made any necessary editorial changes.   [name], CNM [facility] 06/08/2021 2:27 PM

## 2021-06-08 NOTE — Progress Notes (Signed)
Received report from Janine Ores, Charity fundraiser.

## 2021-06-08 NOTE — Progress Notes (Signed)
Michelle Knight is a 20 y.o. G4P1020 at [redacted]w[redacted]d admitted for induction of labor due to hx of IUFD at 38 weeks .  Subjective: Reports feels well. Does not feel any contractions with her epidural.  Objective: BP 112/66   Pulse 67   Temp 98.2 F (36.8 C) (Oral)   Resp 16   Ht 4\' 9"  (1.448 m)   Wt 83.1 kg   LMP 09/13/2020   SpO2 100%   BMI 39.64 kg/m  No intake/output data recorded. Total I/O In: -  Out: 450 [Urine:450]  FHT:  FHR: 130 bpm, variability: moderate,  accelerations:  Present,  decelerations:  Absent UC:   difficult to trace with external monitor, possibly every 2-4 mins SVE:   Dilation: 6 Effacement (%): 70 Station: -1 Exam by:: Dr. [email or web address]  Labs: Lab Results  Component Value Date   WBC 8.0 06/08/2021   HGB 11.8 (L) 06/08/2021   HCT 36.0 06/08/2021   MCV 82.2 06/08/2021   PLT 218 06/08/2021    Assessment / Plan: Induction of labor due to hx of IUFD in prior pregnancy,  on Pit for 2 hrs. Difficult to trace contractions on external monitor. Checked and unchanged from last exam, IUPC placed for better contraction monitoring and optimizing pitocin uptitration  14/11/2020 06/08/2021, 10:27 PM

## 2021-06-09 ENCOUNTER — Encounter (HOSPITAL_COMMUNITY): Payer: Self-pay | Admitting: Obstetrics & Gynecology

## 2021-06-09 ENCOUNTER — Encounter: Payer: Self-pay | Admitting: Family

## 2021-06-09 DIAGNOSIS — Z3A38 38 weeks gestation of pregnancy: Secondary | ICD-10-CM

## 2021-06-09 DIAGNOSIS — Z30014 Encounter for initial prescription of intrauterine contraceptive device: Secondary | ICD-10-CM

## 2021-06-09 DIAGNOSIS — O09293 Supervision of pregnancy with other poor reproductive or obstetric history, third trimester: Secondary | ICD-10-CM

## 2021-06-09 LAB — RPR: RPR Ser Ql: NONREACTIVE

## 2021-06-09 MED ORDER — ACETAMINOPHEN 500 MG PO TABS
1000.0000 mg | ORAL_TABLET | Freq: Four times a day (QID) | ORAL | Status: DC | PRN
  Administered 2021-06-09: 1000 mg via ORAL
  Filled 2021-06-09: qty 2

## 2021-06-09 MED ORDER — ONDANSETRON HCL 4 MG PO TABS: 4.0000 mg | ORAL_TABLET | ORAL | Status: DC | PRN

## 2021-06-09 MED ORDER — PRENATAL MULTIVITAMIN CH
1.0000 | ORAL_TABLET | Freq: Every day | ORAL | Status: DC
  Administered 2021-06-09 – 2021-06-10 (×2): 1 via ORAL
  Filled 2021-06-09 (×2): qty 1

## 2021-06-09 MED ORDER — DIPHENHYDRAMINE HCL 25 MG PO CAPS: 25.0000 mg | ORAL_CAPSULE | Freq: Four times a day (QID) | ORAL | Status: DC | PRN

## 2021-06-09 MED ORDER — COCONUT OIL OIL: 1.0000 "application " | TOPICAL_OIL | Status: DC | PRN

## 2021-06-09 MED ORDER — DIBUCAINE (PERIANAL) 1 % EX OINT: 1.0000 "application " | TOPICAL_OINTMENT | CUTANEOUS | Status: DC | PRN

## 2021-06-09 MED ORDER — MEDROXYPROGESTERONE ACETATE 150 MG/ML IM SUSP: 150.0000 mg | INTRAMUSCULAR | Status: DC | PRN

## 2021-06-09 MED ORDER — MEASLES, MUMPS & RUBELLA VAC IJ SOLR: 0.5000 mL | Freq: Once | INTRAMUSCULAR | Status: DC

## 2021-06-09 MED ORDER — IBUPROFEN 600 MG PO TABS
600.0000 mg | ORAL_TABLET | Freq: Four times a day (QID) | ORAL | Status: DC
  Administered 2021-06-09 – 2021-06-11 (×9): 600 mg via ORAL
  Filled 2021-06-09 (×9): qty 1

## 2021-06-09 MED ORDER — SODIUM CHLORIDE 0.9 % IV SOLN
2.0000 g | Freq: Once | INTRAVENOUS | Status: AC
  Administered 2021-06-09: 2 g via INTRAVENOUS

## 2021-06-09 MED ORDER — TETANUS-DIPHTH-ACELL PERTUSSIS 5-2.5-18.5 LF-MCG/0.5 IM SUSY: 0.5000 mL | PREFILLED_SYRINGE | Freq: Once | INTRAMUSCULAR | Status: DC

## 2021-06-09 MED ORDER — SODIUM CHLORIDE 0.9 % IV SOLN
2.0000 g | Freq: Four times a day (QID) | INTRAVENOUS | Status: AC
  Administered 2021-06-09 – 2021-06-10 (×4): 2 g via INTRAVENOUS
  Filled 2021-06-09 (×4): qty 2000

## 2021-06-09 MED ORDER — LACTATED RINGERS IV BOLUS
500.0000 mL | Freq: Once | INTRAVENOUS | Status: AC
  Administered 2021-06-09: 500 mL via INTRAVENOUS

## 2021-06-09 MED ORDER — ONDANSETRON HCL 4 MG/2ML IJ SOLN: 4.0000 mg | INTRAMUSCULAR | Status: DC | PRN

## 2021-06-09 MED ORDER — SIMETHICONE 80 MG PO CHEW: 80.0000 mg | CHEWABLE_TABLET | ORAL | Status: DC | PRN

## 2021-06-09 MED ORDER — ACETAMINOPHEN 325 MG PO TABS
650.0000 mg | ORAL_TABLET | ORAL | Status: DC | PRN
  Administered 2021-06-09: 650 mg via ORAL

## 2021-06-09 MED ORDER — SENNOSIDES-DOCUSATE SODIUM 8.6-50 MG PO TABS
2.0000 | ORAL_TABLET | Freq: Every day | ORAL | Status: DC
  Administered 2021-06-10: 2 via ORAL
  Filled 2021-06-09: qty 2

## 2021-06-09 MED ORDER — BENZOCAINE-MENTHOL 20-0.5 % EX AERO: 1.0000 "application " | INHALATION_SPRAY | CUTANEOUS | Status: DC | PRN

## 2021-06-09 MED ORDER — GENTAMICIN SULFATE 40 MG/ML IJ SOLN
5.0000 mg/kg | Freq: Once | INTRAVENOUS | Status: AC
  Administered 2021-06-09: 420 mg via INTRAVENOUS
  Filled 2021-06-09: qty 10.5

## 2021-06-09 MED ORDER — WITCH HAZEL-GLYCERIN EX PADS: 1.0000 "application " | MEDICATED_PAD | CUTANEOUS | Status: DC | PRN

## 2021-06-09 NOTE — Anesthesia Postprocedure Evaluation (Signed)
Anesthesia Post Note  Patient: Michelle Knight  Procedure(s) Performed: AN AD HOC LABOR EPIDURAL     Patient location during evaluation: Mother Baby Anesthesia Type: Epidural Level of consciousness: awake and alert and oriented Pain management: satisfactory to patient Vital Signs Assessment: post-procedure vital signs reviewed and stable Respiratory status: respiratory function stable Cardiovascular status: stable Postop Assessment: no headache, no backache, epidural receding, patient able to bend at knees, no signs of nausea or vomiting, adequate PO intake and able to ambulate Anesthetic complications: no   No notable events documented.  Last Vitals:  Vitals:   06/09/21 0731 06/09/21 0757  BP: (!) 106/59 (!) 99/51  Pulse: 72 61  Resp:  18  Temp: 36.6 C 36.6 C  SpO2:  100%    Last Pain:  Vitals:   06/09/21 0757  TempSrc: Oral  PainSc:    Pain Goal:                   Cova Knieriem

## 2021-06-09 NOTE — Progress Notes (Signed)
ANTIBIOTIC CONSULT NOTE - INITIAL  Pharmacy Consult for Gentamicin Indication: Chorioamnionitis   Allergies  Allergen Reactions   Other Itching, Swelling and Other (See Comments)    Certain exotic fruits...mango, dragonfruit, papaya,etc. Tongue swells.   Mixed Grasses Itching   Tramadol Hives    Patient Measurements: Height: 4\' 9"  (144.8 cm) Weight: 83.1 kg (183 lb 3.2 oz) IBW/kg (Calculated) : 38.6   Vital Signs: Temp: 101.6 F (38.7 C) (12/06 0325) Temp Source: Axillary (12/06 0325) BP: 113/60 (12/06 0416) Pulse Rate: 102 (12/06 0416)  Labs: Recent Labs    06/08/21 0949  WBC 8.0  HGB 11.8*  PLT 218   No results for input(s): GENTTROUGH, GENTPEAK, GENTRANDOM in the last 72 hours.   Microbiology: Recent Results (from the past 720 hour(s))  OB RESULT CONSOLE Group B Strep     Status: None   Collection Time: 05/21/21 12:00 AM  Result Value Ref Range Status   GBS Negative  Final  Culture, beta strep (group b only)     Status: None   Collection Time: 05/21/21  2:57 PM   Specimen: Genital  Result Value Ref Range Status   MICRO NUMBER: 05/23/21  Final   SPECIMEN QUALITY: Adequate  Final   SOURCE: NOT GIVEN  Final   STATUS: FINAL  Final   RESULT: No group B Streptococcus isolated  Final   COMMENT:   Final    Note per CDC guidelines optimal recovery is achieved by swabbing both the lower vagina and rectum (through the anal sphincter).  Resp Panel by RT-PCR (Flu A&B, Covid) Nasopharyngeal Swab     Status: None   Collection Time: 06/08/21 10:15 AM   Specimen: Nasopharyngeal Swab; Nasopharyngeal(NP) swabs in vial transport medium  Result Value Ref Range Status   SARS Coronavirus 2 by RT PCR NEGATIVE NEGATIVE Final    Comment: (NOTE) SARS-CoV-2 target nucleic acids are NOT DETECTED.  The SARS-CoV-2 RNA is generally detectable in upper respiratory specimens during the acute phase of infection. The lowest concentration of SARS-CoV-2 viral copies this assay can detect  is 138 copies/mL. A negative result does not preclude SARS-Cov-2 infection and should not be used as the sole basis for treatment or other patient management decisions. A negative result may occur with  improper specimen collection/handling, submission of specimen other than nasopharyngeal swab, presence of viral mutation(s) within the areas targeted by this assay, and inadequate number of viral copies(<138 copies/mL). A negative result must be combined with clinical observations, patient history, and epidemiological information. The expected result is Negative.  Fact Sheet for Patients:  14/05/22  Fact Sheet for Healthcare Providers:  BloggerCourse.com  This test is no t yet approved or cleared by the SeriousBroker.[place] FDA and  has been authorized for detection and/or diagnosis of SARS-CoV-2 by FDA under an Emergency Use Authorization (EUA). This EUA will remain  in effect (meaning this test can be used) for the duration of the COVID-19 declaration under Section 564(b)(1) of the Act, 21 U.S.C.section 360bbb-3(b)(1), unless the authorization is terminated  or revoked sooner.       Influenza A by PCR NEGATIVE NEGATIVE Final   Influenza B by PCR NEGATIVE NEGATIVE Final    Comment: (NOTE) The Xpert Xpress SARS-CoV-2/FLU/RSV plus assay is intended as an aid in the diagnosis of influenza from Nasopharyngeal swab specimens and should not be used as a sole basis for treatment. Nasal washings and aspirates are unacceptable for Xpert Xpress SARS-CoV-2/FLU/RSV testing.  Fact Sheet for Patients: [place]  Fact  Sheet for Healthcare Providers: SeriousBroker.it  This test is not yet approved or cleared by the [place] and has been authorized for detection and/or diagnosis of SARS-CoV-2 by FDA under an Emergency Use Authorization (EUA). This EUA will remain in effect  (meaning this test can be used) for the duration of the COVID-19 declaration under Section 564(b)(1) of the Act, 21 U.S.C. section 360bbb-3(b)(1), unless the authorization is terminated or revoked.  Performed at [facility], [address] [place], [place] [address]     Medications:  Ampicillin 2 gm IV once  Plan:  Gentamicin 420 mg (5 mg/kg) IV x 1   Thank you for the consult  Sherrilyn Rist 06/09/2021,4:25 AM

## 2021-06-09 NOTE — Discharge Summary (Signed)
Postpartum Discharge Summary     Patient Name: Michelle Knight DOB: February 11, 2001 MRN: [id number]  Date of admission: 06/08/2021 Delivery date:06/09/2021  Delivering provider: Renard Matter  Date of discharge: 06/11/2021  Admitting diagnosis: History of IUFD [Z87.59] Intrauterine pregnancy: [redacted]w[redacted]d    Secondary diagnosis:  Principal Problem:   History of IUFD Active Problems:   Supervision of high risk pregnancy, antepartum   History of anxiety   Vaginal delivery Intrauterine infection- post delivery  Additional problems: None    Discharge diagnosis: Term Pregnancy Delivered                                              Post partum procedures: none Augmentation: AROM, Pitocin, Cytotec, and IP Foley Complications: none  Hospital course: Induction of Labor With Vaginal Delivery   20y.o. yo [id number] admitted to the hospital 06/08/2021 for induction of labor.  Indication for induction:  hx of IUFD in prior pregnancy .  Patient had an uncomplicated labor course as follows: Membrane Rupture Time/Date: 5:48 PM ,06/08/2021   Delivery Method:Vaginal, Spontaneous  Episiotomy: None  Lacerations:  Periurethral  Details of delivery can be found in separate delivery note.  IUD was placed during immediate postpartum.  Due to concern for intrauterine infection, she was treated with IV Amp/Gent x 24hrs.  She has been afebrile for over 24hrs. Patient is discharged home 06/11/21.  Newborn Data: Birth date:06/09/2021  Birth time:2:08 AM  Gender:Female  Living status:Living  Apgars:5 ,5  Weight:3010 g   Magnesium Sulfate received: No BMZ received: No Rhophylac:N/A MMR:N/A T-DaP:Given prenatally FlYYQ:MGNOIrenatally Transfusion:No  Physical exam  Vitals:   06/10/21 1518 06/10/21 2022 06/11/21 0032 06/11/21 0602  BP: 120/73 (!) 119/54 (!) 105/53 (!) 89/50  Pulse: 83 60 95 71  Resp: _0 Temp: 97.8 F (36.6 C) 98 F (36.7 C) 98.3 F (36.8 C) 98.6 F (37 C)  TempSrc:  Oral     SpO2: 99% 99% 99% 99%  Weight:      Height:       General: alert, cooperative, and no distress CV: RRR Lungs: CTAB Lochia: appropriate Uterine Fundus: firm, non-tender, below umbilicus Incision: N/A DVT Evaluation: No evidence of DVT seen on physical exam. Labs: Lab Results  Component Value Date   WBC 8.0 06/08/2021   HGB 11.8 (L) 06/08/2021   HCT 36.0 06/08/2021   MCV 82.2 06/08/2021   PLT 218 06/08/2021   CMP Latest Ref Rng & Units 04/29/2020  Glucose 70 - 99 mg/dL 107(H)  BUN 6 - 20 mg/dL 9  Creatinine 0.44 - 1.00 mg/dL 0.61  Sodium 135 - 145 mmol/L 138  Potassium 3.5 - 5.1 mmol/L 3.8  Chloride 98 - 111 mmol/L 104  CO2 22 - 32 mmol/L 26  Calcium 8.9 - 10.3 mg/dL 9.5  Total Protein 6.5 - 8.1 g/dL 7.7  Total Bilirubin 0.3 - 1.2 mg/dL 0.3  Alkaline Phos 38 - 126 U/L 89  AST 15 - 41 U/L 14(L)  ALT 0 - 44 U/L 12   Edinburgh Score: Edinburgh Postnatal Depression Scale Screening Tool 06/10/2021  I have been able to laugh and see the funny side of things. 0  I have looked forward with enjoyment to things. 1  I have blamed myself unnecessarily when things went wrong. 2  I have been anxious or  worried for no good reason. 2  I have felt scared or panicky for no good reason. 2  Things have been getting on top of me. 1  I have been so unhappy that I have had difficulty sleeping. 1  I have felt sad or miserable. 2  I have been so unhappy that I have been crying. 1  The thought of harming myself has occurred to me. 1  Edinburgh Postnatal Depression Scale Total 13     After visit meds:  Allergies as of 06/11/2021       Reactions   Other Itching, Swelling, Other (See Comments)   Certain exotic fruits...mango, dragonfruit, papaya,etc. Tongue swells.   Mixed Grasses Itching   Tramadol Hives        Medication List     STOP taking these medications    aspirin EC 81 MG tablet       TAKE these medications    acetaminophen 325 MG tablet Commonly known  as: Tylenol Take 2 tablets (650 mg total) by mouth every 6 (six) hours as needed (for pain scale < 4).   GENTLE IRON PO Take by mouth.   ibuprofen 600 MG tablet Commonly known as: ADVIL Take 1 tablet (600 mg total) by mouth every 6 (six) hours as needed.   PRENATAL VITAMINS PO Take by mouth.         Discharge home in stable condition Infant Feeding: Bottle Infant Disposition:NICU Discharge instruction: per After Visit Summary and Postpartum booklet. Activity: Advance as tolerated. Pelvic rest for 6 weeks.  Diet: routine diet Future Appointments: Future Appointments  Date Time Provider [place]  06/16/2021  3:10 PM Elvera Maria, CNM [facility]  06/22/2021 10:30 AM [facility] None  06/22/2021 10:45 AM Celso Amy, NP [facility] None  07/07/2021  1:50 PM Leftwich-Kirby, Kathie Dike, CNM [facility]   Follow up Visit:  [place] [facility] [place] [facility] Follow up in 5 week(s).   Specialty: Obstetrics and Gynecology Contact information: [place], [place] [phone]               Message sent to [facility] by Dr. Cy Blamer 06/09/2021  Please schedule this patient for a In person postpartum visit in 4 weeks with the following provider: Any provider. Additional Postpartum F/U:Postpartum Depression checkup in 1 week High risk pregnancy complicated by:  hx of IUFD Delivery mode:  Vaginal, Spontaneous  Anticipated Birth Control:   pp IUD placed   06/11/2021 Annalee Genta, DO

## 2021-06-10 ENCOUNTER — Encounter: Payer: Self-pay | Admitting: Family

## 2021-06-10 NOTE — Clinical Social Work Maternal (Signed)
CLINICAL SOCIAL WORK MATERNAL/CHILD NOTE  Patient Details  Name: Michelle Knight MRN: [id number] Date of Birth: 12/26/2000  Date:  06/10/2021  Clinical Social Worker Initiating Note:  Benisha Hadaway, LCSW Date/Time: Initiated:  06/10/21/1029     Child's Name:  Michelle Knight   Biological Parents:  Mother, Father (Father: Austin Knight)   Need for Interpreter:  None   Reason for Referral:  Behavioral Health Concerns, Other (Comment) (Infant's NICU Admission)   Address:  [address] [place] [address]    Phone number:  [phone] (home)     Additional phone number:   Household Members/Support Persons (HM/SP):   Household Member/Support Person 1, Household Member/Support Person 2   HM/SP Name Relationship DOB or Age  HM/SP -1 Austin Knight FOB    HM/SP -2   FOB's father    HM/SP -3        HM/SP -4        HM/SP -5        HM/SP -6        HM/SP -7        HM/SP -8          Natural Supports (not living in the home):  Parent   Professional Supports: None   Employment: Full-time   Type of Work: Manager   Education:  High school graduate   Homebound arranged:    Financial Resources:  Private Insurance    Other Resources:      Cultural/Religious Considerations Which May Impact Care:    Strengths:  Ability to meet basic needs  , Pediatrician chosen, Understanding of illness, Home prepared for child     Psychotropic Medications:         Pediatrician:    [place] area  Pediatrician List:   [place]    [place] Other ([facility])  [place]    [place]    [place]    [place]      Pediatrician Fax Number:    Risk Factors/Current Problems:  Mental Health Concerns     Cognitive State:  Alert  , Able to Concentrate  , Insightful  , Goal Oriented  , Linear Thinking     Mood/Affect:  Calm  , Interested  , Comfortable  , Relaxed     CSW Assessment: CSW met with MOB at bedside to complete  psychosocial assessment, FOB present. MOB granted CSW verbal permission to speak about anything in front of FOB. CSW introduced self and explained role. MOB was welcoming, open, pleasant, and remained engaged during assessment. MOB reported that she resides with FOB and FOB's father. 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 MOB reported that they have all items needed to care for infant including a car seat, basinet, and crib. CSW inquired about MOB's support system, MOB reported that FOB and her mom are supports.   CSW inquired about MOB's mental health history. MOB reported that she was diagnosed with anxiety, depression, and borderline personality disorder 5 years ago. MOB reported that she is currently having anxiety surrounding infant's NICU admission. MOB described her anxiety as having racing thoughts and over worrying. MOB shared that being at the hospital is a trigger for her anxiety. MOB reported that she was on medication prior to pregnancy and plans to restart medication now that she has given birth. MOB verbalized plan to follow up with her OB provider regarding restarting medication. CSW encouraged MOB to speak with her OB provider   prior to discharge regarding her medication options, MOB agreed. CSW asked if MOB was interested in therapy resources, MOB reported no. CSW inquired about MOB's coping skills, MOB reported that essential oils, her dog, and FOB all help her to cope. MOB denied any current depressive symptoms, noting it has just been a lot. CSW acknowledged, validated, and normalized MOB's experience feeling like it is a lot surrounding infant's NICU admission. CSW and MOB discussed emotions associated with a NICU admission. MOB shared about her borderline personality disorder and described it as random outbursts. MOB reported that she is not aware of any triggers but knows that increased anxiety can impact her borderline personality disorder. CSW and MOB discussed edinburgh  score 13. MOB reported that her past seven days have been stressful and nerve wrecking. CSW inquired about MOB answering (hardly ever) to question 10 (the thought of harming myself has occurred to me). MOB reported that she wasn't having thoughts of harming herself but thoughts about increased stress leading her to have thoughts of self harm. MOB shared that she has not harmed herself in over a year and she has a good support system for that. CSW positively affirmed MOB not having any self harm behaviors in over a year and discussed protective factors. CSW assessed for safety, MOB denied SI and HI. CSW did not assess for domestic violence as FOB was present. MOB presented calm and possessed insight about her mental health history. MOB did not demonstrate any acute mental health signs/symptoms. CSW informed MOB that she may be more susceptible to postpartum depression due to her mental health history.   CSW provided education regarding the baby blues period vs. perinatal mood disorders, discussed treatment and gave resources for mental health follow up if concerns arise.  CSW recommends self-evaluation during the postpartum time period using the New Mom Checklist from Postpartum Progress and encouraged MOB to contact a medical professional if symptoms are noted at any time.    CSW provided review of Sudden Infant Death Syndrome (SIDS) precautions.    CSW and MOB discussed infant's NICU admission. CSW informed MOB about the NICU, what to expect, and resources/supports available while infant is admitted to the NICU. MOB reported that they now feel informed about infant's care and denied any transportation barriers with visiting infant in the NICU. MOB denied any questions/concerns regarding the NICU.   CSW will continue to offer resources/supports available while infant is admitted to the NICU.   CSW Plan/Description:  Sudden Infant Death Syndrome (SIDS) Education, Perinatal Mood and Anxiety Disorder (PMADs)  Education, Other Patient/Family Education, Psychosocial Support and Ongoing Assessment of Needs    Kamala Kolton L [place], LCSW 06/10/2021, 11:50 AM 

## 2021-06-10 NOTE — Progress Notes (Signed)
Post Partum Day 1 Subjective: no complaints, up ad lib, voiding, and tolerating PO  Objective: Blood pressure (!) 103/54, pulse (!) 51, temperature 98.2 F (36.8 C), temperature source Oral, resp. rate 17, height 4\' 9"  (1.448 m), weight 83.1 kg, last menstrual period 09/13/2020, SpO2 99 %, unknown if currently breastfeeding.  Physical Exam:  General: alert, cooperative, and appears stated age 20: appropriate Uterine Fundus: firm DVT Evaluation: No evidence of DVT seen on physical exam.  Recent Labs    06/08/21 0949  HGB 11.8*  HCT 36.0    Assessment/Plan: Plan for discharge tomorrow, Circumcision prior to discharge, and Contraception pp IUD placed   LOS: 2 days   14/05/22 06/10/2021, 11:16 AM

## 2021-06-11 ENCOUNTER — Encounter: Payer: Self-pay | Admitting: Family

## 2021-06-11 MED ORDER — ACETAMINOPHEN 325 MG PO TABS: 650.0000 mg | ORAL_TABLET | Freq: Four times a day (QID) | ORAL | Status: DC | PRN

## 2021-06-11 MED ORDER — IBUPROFEN 600 MG PO TABS: 600.0000 mg | ORAL_TABLET | Freq: Four times a day (QID) | ORAL | 0 refills | Status: DC | PRN

## 2021-06-11 NOTE — Progress Notes (Signed)
Discharge instructions and prescriptions given to pt. Discussed post vaginal delivery care, signs and symptoms to report to the MD, upcoming appointments, and meds. Pt verbalizes understanding and has no questions at this time. Pt discharged home from hospital in stable condition.

## 2021-06-12 ENCOUNTER — Telehealth: Payer: Self-pay

## 2021-06-12 NOTE — Telephone Encounter (Signed)
Transition Care Management Follow-up Telephone Call Date of discharge and from where: 06/11/2021-[facility] How have you been since you were released from the hospital? Patient stated she is doing fine.  Any questions or concerns? No  Items Reviewed: Did the pt receive and understand the discharge instructions provided? Yes  Medications obtained and verified? Yes  Other? No  Any new allergies since your discharge? No  Dietary orders reviewed? No Do you have support at home? Yes   Home Care and Equipment/Supplies: Were home health services ordered? not applicable If so, what is the name of the agency? N/A  Has the agency set up a time to come to the patient's home? not applicable Were any new equipment or medical supplies ordered?  No What is the name of the medical supply agency? N/A Were you able to get the supplies/equipment? not applicable Do you have any questions related to the use of the equipment or supplies? No  Functional Questionnaire: (I = Independent and D = Dependent) ADLs: I  Bathing/Dressing- I  Meal Prep- I  Eating- I  Maintaining continence- I  Transferring/Ambulation- I  Managing Meds- I  Follow up appointments reviewed:  PCP Hospital f/u appt confirmed? No   Specialist Hospital f/u appt confirmed? Yes  Scheduled to see OBGYN on 06/16/2021 @ 3:10PM. Are transportation arrangements needed? No  If their condition worsens, is the pt aware to call PCP or go to the Emergency Dept.? Yes Was the patient provided with contact information for the PCP's office or ED? Yes Was to pt encouraged to call back with questions or concerns? Yes

## 2021-06-16 ENCOUNTER — Encounter: Payer: Self-pay | Admitting: Advanced Practice Midwife

## 2021-06-16 ENCOUNTER — Other Ambulatory Visit: Payer: Self-pay

## 2021-06-16 ENCOUNTER — Ambulatory Visit (INDEPENDENT_AMBULATORY_CARE_PROVIDER_SITE_OTHER): Payer: BC Managed Care – PPO | Admitting: Advanced Practice Midwife

## 2021-06-16 VITALS — BP 117/69 | HR 59 | Ht <= 58 in | Wt 166.0 lb

## 2021-06-16 DIAGNOSIS — Z8759 Personal history of other complications of pregnancy, childbirth and the puerperium: Secondary | ICD-10-CM

## 2021-06-16 DIAGNOSIS — F53 Postpartum depression: Secondary | ICD-10-CM | POA: Diagnosis not present

## 2021-06-16 MED ORDER — ESCITALOPRAM OXALATE 10 MG PO TABS: 10.0000 mg | ORAL_TABLET | Freq: Every day | ORAL | 2 refills | Status: DC

## 2021-06-16 NOTE — Progress Notes (Signed)
° °  GYNECOLOGY PROGRESS NOTE  History:  20 y.o. [id number] presents to [facility] office today for 1 week postpartum mood check.  She score 13 on PHQ9 today.  She reports that it was very stressful at the hospital when the NICU was called after her delivery, since she has a history of IUFD at term.  The baby had to stay in the hospital 4 days and that caused stress. She is not feeling well supported at home.  She denies any thoughts of harming herself or others but is feeling very overwhelmed.   Health Maintenance Due  Topic Date Due   COVID-19 Vaccine (1) Never done   HPV VACCINES (1 - 2-dose series) Never done     Review of Systems:  Pertinent items are noted in HPI.   Objective:  Physical Exam Blood pressure 117/69, pulse (!) 59, height 4\' 9"  (1.448 m), weight 166 lb (75.3 kg), last menstrual period 09/13/2020, not currently breastfeeding. VS reviewed, nursing note reviewed,  Constitutional: well developed, well nourished, no distress HEENT: normocephalic CV: normal rate Pulm/chest wall: normal effort Breast Exam: deferred Abdomen: soft Neuro: alert and oriented x 3 Skin: warm, dry Psych: affect normal Pelvic exam: Deferred  Assessment & Plan:  1. Postpartum depression --Pt with hx depression and took Lexapro before the pregnancy.  She had a counselor as well that she has not seen in a while.  She is planning to reconnect with her counselor but is interested in restarting medications and will see integrated BH as soon as available. --F/U in 4 weeks --Pt given warning signs/reasons to seek urgent/emergent care  - escitalopram (LEXAPRO) 10 MG tablet; Take 1 tablet (10 mg total) by mouth daily.  Dispense: 30 tablet; Refill: 2 - Ambulatory referral to Integrated Behavioral Health  2. History of IUFD --With previous pregnancy, stress and anxiety related to this although she is happy with her baby and baby is home doing very well.   11/13/2020, CNM 3:50 PM

## 2021-06-16 NOTE — Progress Notes (Signed)
PPD Score: 13

## 2021-06-17 ENCOUNTER — Encounter: Payer: Self-pay | Admitting: Family

## 2021-06-19 ENCOUNTER — Encounter: Payer: Self-pay | Admitting: Family

## 2021-06-22 ENCOUNTER — Encounter: Payer: Self-pay | Admitting: Advanced Practice Midwife

## 2021-06-22 ENCOUNTER — Ambulatory Visit: Payer: BC Managed Care – PPO | Admitting: Family

## 2021-06-22 ENCOUNTER — Other Ambulatory Visit: Payer: BC Managed Care – PPO

## 2021-06-23 ENCOUNTER — Ambulatory Visit (INDEPENDENT_AMBULATORY_CARE_PROVIDER_SITE_OTHER): Payer: BC Managed Care – PPO | Admitting: Licensed Clinical Social Worker

## 2021-06-23 ENCOUNTER — Telehealth (HOSPITAL_COMMUNITY): Payer: Self-pay | Admitting: *Deleted

## 2021-06-23 DIAGNOSIS — F53 Postpartum depression: Secondary | ICD-10-CM | POA: Diagnosis not present

## 2021-06-23 NOTE — BH Specialist Note (Signed)
Integrated Behavioral Health via Telemedicine Visit  06/23/2021 Michelle Knight [id number]  Number of Integrated Behavioral Health visits: 1 Session Start time: 1:04pm  Session End time: 1:28pm Total time: 24 mins via mychart video   Referring Provider: Bonna Gains Patient/Family location: Home  [facility] Provider location: [facility]  All persons participating in visit: Pt Michelle Knight and LCSW Michelle Knight  Types of Service: Individual psychotherapy and Video visit  I connected with Michelle Knight and/or Michelle Knight n/a via  Telephone or Video Enabled Telemedicine Application  (Video is Caregility application) and verified that I am speaking with the correct person using two identifiers. Discussed confidentiality: Yes   I discussed the limitations of telemedicine and the availability of in person appointments.  Discussed there is a possibility of technology failure and discussed alternative modes of communication if that failure occurs.  I discussed that engaging in this telemedicine visit, they consent to the provision of behavioral healthcare and the services will be billed under their insurance.  Patient and/or legal guardian expressed understanding and consented to Telemedicine visit: Yes   Presenting Concerns: Patient and/or family reports the following symptoms/concerns: postpartum depression  Duration of problem: approx 2 weeks; Severity of problem: mild  Patient and/or Family's Strengths/Protective Factors: Concrete supports in place (healthy food, safe environments, etc.)  Goals Addressed: Patient will:  Reduce symptoms of: depression   Increase knowledge and/or ability of: coping skills   Demonstrate ability to: Increase healthy adjustment to current life circumstances  Progress towards Goals: Ongoing  Interventions: Interventions utilized:  Supportive Counseling Standardized Assessments completed: Edinburgh Postnatal Depression   Assessment: Patient  currently experiencing postpartum depression. Michelle Knight reports loss of appetite, interest, always sleeping, and depressed mood. Michelle Knight reports starting to take lexapro. According to Michelle Knight, spouse is supportive and she is bonding well with newborn   Patient may benefit from integrated behavioral health.  Plan: Follow up with behavioral health clinician on : 3 weeks via mychart Behavioral recommendations: Safe physical exercise, engage in support group to decrease social isolation, communicate needs with father of baby for added support, and intentionally schedule self care and previous activities of interest  Referral(s): Integrated [employer] (In Clinic)  I discussed the assessment and treatment plan with the patient and/or parent/guardian. They were provided an opportunity to ask questions and all were answered. They agreed with the plan and demonstrated an understanding of the instructions.   They were advised to call back or seek an in-person evaluation if the symptoms worsen or if the condition fails to improve as anticipated.  Michelle Saxon, LCSW

## 2021-06-23 NOTE — Telephone Encounter (Signed)
Attempted Hospital Discharge Follow-Up Call.  Voice mail box is full.  Unable to leave a message.  

## 2021-06-24 ENCOUNTER — Encounter: Payer: Self-pay | Admitting: Family

## 2021-06-25 ENCOUNTER — Encounter: Payer: Self-pay | Admitting: Family

## 2021-07-03 NOTE — Progress Notes (Signed)
Post Partum Visit Note  Michelle Knight is a 20 y.o. [id number] female who presents for a postpartum visit. She is 4 weeks postpartum following a normal spontaneous vaginal delivery.  I have fully reviewed the prenatal and intrapartum course. The delivery was at 38.3 gestational weeks.  Anesthesia: epidural. Postpartum course has been unremarkable. Baby is doing well. Baby is feeding by bottle - Gerber Gentle . Bleeding staining only. Bowel function is normal. Bladder function is normal. Patient is not sexually active. Contraception method is IUD. Postpartum depression screening: negative-score 9.   The pregnancy intention screening data noted above was reviewed. Potential methods of contraception were discussed. The patient elected to proceed with No data recorded.   Edinburgh Postnatal Depression Scale - 07/07/21 1159       Edinburgh Postnatal Depression Scale:  In the Past 7 Days   I have been able to laugh and see the funny side of things. 1    I have looked forward with enjoyment to things. 1    I have blamed myself unnecessarily when things went wrong. 1    I have been anxious or worried for no good reason. 2    I have felt scared or panicky for no good reason. 1    Things have been getting on top of me. 2    I have been so unhappy that I have had difficulty sleeping. 0    I have felt sad or miserable. 1    I have been so unhappy that I have been crying. 0    The thought of harming myself has occurred to me. 0    Edinburgh Postnatal Depression Scale Total 9             Health Maintenance Due  Topic Date Due   COVID-19 Vaccine (1) Never done   HPV VACCINES (1 - 2-dose series) Never done    The following portions of the patient's history were reviewed and updated as appropriate: allergies, current medications, past family history, past medical history, past social history, past surgical history, and problem list.  Review of Systems Pertinent items noted in HPI and  remainder of comprehensive ROS otherwise negative.  Objective:  BP 111/74    Pulse 70    Ht 4\' 9"  (1.448 m)    Wt 162 lb (73.5 kg)    LMP 09/13/2020    Breastfeeding No    BMI 35.06 kg/m    VS reviewed, nursing note reviewed,  Constitutional: well developed, well nourished, no distress HEENT: normocephalic CV: normal rate Pulm/chest wall: normal effort Breast Exam: Deferred changes or axillary nodes Abdomen: soft Neuro: alert and oriented x 3 Skin: warm, dry Psych: affect normal Pelvic exam: Cervix pink, visually closed, without lesion, scant white creamy discharge, vaginal walls and external genitalia normal. Suture x 1 visible at left periurethral area skin well approximated with no edema or erythema  Assessment:  1. Postpartum depression --Pt started on Lexapro on 06/16/21, some improvement.  Will increase dose from 10 mg to 20 mg daily. --F/U mood check in 1 month - escitalopram (LEXAPRO) 20 MG tablet; Take 1 tablet (20 mg total) by mouth daily.  Dispense: 30 tablet; Refill: 11 - Ambulatory referral to Psychiatry  2. Chronic nonintractable headache, unspecified headache type --Pt with migraines/chronic h/a. Has had work-up, tried Imitrex without success.  Desires new evaluation for h/a.  - Ambulatory referral to Neurology  3. Postpartum care following vaginal delivery --Overall doing well.  Mood slightly improved from  1 week PP check up with PPD screening score down from 13 to 9.   --Bonding well with baby    Plan:   Essential components of care per ACOG recommendations:  1.  Mood and well being: Patient with equivocal depression screening today. Reviewed local resources for support.  - Patient tobacco use? No.   - hx of drug use? No.    2. Infant care and feeding:  -Patient currently breastmilk feeding? No.  -Social determinants of health (SDOH) reviewed in EPIC. No concerns  3. Sexuality, contraception and birth spacing - Patient does not want a pregnancy in  the next year.   - Reviewed forms of contraception in tiered fashion. Pt with IUD placed in hospital. --IUD string visualized and cut to 3-4 cm in length from cervical os at today's visit --pt without problems with IUD - Discussed birth spacing of 18 months  4. Sleep and fatigue -Encouraged family/partner/community support of 4 hrs of uninterrupted sleep to help with mood and fatigue  5. Physical Recovery  - Discussed patients delivery and complications. She describes her labor as good. - Patient had a Vaginal, no problems at delivery. Patient had a   periurethral  laceration. Perineal healing reviewed. Patient expressed understanding - Patient has urinary incontinence? No. - Patient is safe to resume physical and sexual activity  6.  Health Maintenance - HM due items addressed Yes - Last pap smear  Diagnosis  Date Value Ref Range Status  12/09/2020   Final   - Negative for intraepithelial lesion or malignancy (NILM)   Pap smear not done at today's visit.  -Breast Cancer screening indicated? No.   7. Chronic Disease/Pregnancy Condition follow up:  headaches and depression  - Refer to [facility] and to neurology  Sharen Counter, CNM [facility], [facility]

## 2021-07-07 ENCOUNTER — Other Ambulatory Visit: Payer: Self-pay

## 2021-07-07 ENCOUNTER — Encounter: Payer: Self-pay | Admitting: Advanced Practice Midwife

## 2021-07-07 ENCOUNTER — Ambulatory Visit (INDEPENDENT_AMBULATORY_CARE_PROVIDER_SITE_OTHER): Payer: BC Managed Care – PPO | Admitting: Advanced Practice Midwife

## 2021-07-07 VITALS — BP 111/74 | HR 70 | Ht <= 58 in | Wt 162.0 lb

## 2021-07-07 DIAGNOSIS — G8929 Other chronic pain: Secondary | ICD-10-CM | POA: Diagnosis not present

## 2021-07-07 DIAGNOSIS — F53 Postpartum depression: Secondary | ICD-10-CM | POA: Diagnosis not present

## 2021-07-07 DIAGNOSIS — R519 Headache, unspecified: Secondary | ICD-10-CM | POA: Diagnosis not present

## 2021-07-07 MED ORDER — ESCITALOPRAM OXALATE 20 MG PO TABS: 20.0000 mg | ORAL_TABLET | Freq: Every day | ORAL | 11 refills | Status: DC

## 2021-07-08 ENCOUNTER — Encounter: Payer: Self-pay | Admitting: Neurology

## 2021-07-28 ENCOUNTER — Other Ambulatory Visit: Payer: Self-pay

## 2021-07-28 ENCOUNTER — Ambulatory Visit (INDEPENDENT_AMBULATORY_CARE_PROVIDER_SITE_OTHER): Payer: BC Managed Care – PPO | Admitting: Advanced Practice Midwife

## 2021-07-28 ENCOUNTER — Encounter: Payer: Self-pay | Admitting: Advanced Practice Midwife

## 2021-07-28 VITALS — BP 108/71 | HR 67 | Ht <= 58 in | Wt 167.0 lb

## 2021-07-28 DIAGNOSIS — R519 Headache, unspecified: Secondary | ICD-10-CM

## 2021-07-28 DIAGNOSIS — G8929 Other chronic pain: Secondary | ICD-10-CM

## 2021-07-28 DIAGNOSIS — F53 Postpartum depression: Secondary | ICD-10-CM | POA: Diagnosis not present

## 2021-07-28 NOTE — Progress Notes (Signed)
° °  GYNECOLOGY PROGRESS NOTE  History:  21 y.o. [id number] presents to [facility] office today for follow up postpartum mood check/medication follow up visit.  She reports mood is much better than last visit and Lexapro 20 mg daily is helping. She is returning to work tomorrow and has concerns about that but is overall doing well. She is still having daily headaches and has follow up with neurology next week.  She denies  dizziness, shortness of breath, n/v, or fever/chills.    The following portions of the patient's history were reviewed and updated as appropriate: allergies, current medications, past family history, past medical history, past social history, past surgical history and problem list. Last pap smear on 12/09/20 was normal.  Health Maintenance Due  Topic Date Due   COVID-19 Vaccine (1) Never done   HPV VACCINES (1 - 2-dose series) Never done     Review of Systems:  Pertinent items are noted in HPI.   Objective:  Physical Exam Blood pressure 108/71, pulse 67, height 4\' 9"  (1.448 m), weight 167 lb (75.8 kg), last menstrual period 09/13/2020, not currently breastfeeding. VS reviewed, nursing note reviewed,  Constitutional: well developed, well nourished, no distress HEENT: normocephalic CV: normal rate Pulm/chest wall: normal effort Breast Exam: deferred Abdomen: soft Neuro: alert and oriented x 3 Skin: warm, dry Psych: affect normal Pelvic exam: Deferred  Assessment & Plan:  1. Postpartum depression --Improved on Lexapro  --Pt has appt with behavioral health to follow up  2. Chronic nonintractable headache, unspecified headache type --Neurology follow up  3. Postpartum care following vaginal delivery   Fatima Blank, CNM 11:58 AM

## 2021-07-28 NOTE — Progress Notes (Signed)
Pt doing well on Lexapro.

## 2021-08-18 NOTE — Progress Notes (Unsigned)
NEUROLOGY CONSULTATION NOTE  Michelle Knight MRN: [id number] DOB: 2001-01-27  Referring provider: Sharen Counter, CNM Primary care provider: Gayleen Orem, MD  Reason for consult:  headache  Assessment/Plan:   ***   Subjective:  Michelle Knight is a 21 year old female who presents for headache.  History supplemented by referring provider's note.  Onset:  *** Location:  *** Quality:  *** Intensity:  ***.  *** denies new headache, thunderclap headache or severe headache that wakes *** from sleep. Aura:  *** Prodrome:  *** Postdrome:  *** Associated symptoms:  ***.  *** denies associated unilateral numbness or weakness. Duration:  *** Frequency:  *** Frequency of abortive medication: *** Triggers:  *** Relieving factors:  *** Activity:  ***  Current NSAIDS/analgesics:  acetaminophen, ibuprofen Current triptans:  none Current ergotamine:  none Current anti-emetic:  none Current muscle relaxants:  none Current Antihypertensive medications:  none Current Antidepressant medications:  escital Current Anticonvulsant medications:  *** Current anti-CGRP:  *** Current Vitamins/Herbal/Supplements:  *** Current Antihistamines/Decongestants:  *** Other therapy:  *** Hormone/birth control:  IUD Other medications:  ***  Past NSAIDS/analgesics:  ***, tramadol Past abortive triptans:  sumatriptan 25mg  (ineffective) Past abortive ergotamine:  none Past muscle relaxants:  none Past anti-emetic:  none Past antihypertensive medications:  none Past antidepressant medications:  *** Past anticonvulsant medications:  topiramate Past anti-CGRP:  none Past vitamins/Herbal/Supplements:  *** Past antihistamines/decongestants:  Flonase Other past therapies:  ***  Caffeine:  *** Alcohol:  *** Smoker:  *** Diet:  *** Exercise:  *** Depression:  ***; Anxiety:  *** Other pain:  *** Sleep hygiene:  *** Family history of headache:  ***      PAST MEDICAL HISTORY: Past  Medical History:  Diagnosis Date   Anemia    IUFD at 20 weeks or more of gestation     PAST SURGICAL HISTORY: Past Surgical History:  Procedure Laterality Date   WISDOM TOOTH EXTRACTION      MEDICATIONS: Current Outpatient Medications on File Prior to Visit  Medication Sig Dispense Refill   acetaminophen (TYLENOL) 325 MG tablet Take 2 tablets (650 mg total) by mouth every 6 (six) hours as needed (for pain scale < 4). (Patient not taking: Reported on 07/07/2021)     escitalopram (LEXAPRO) 20 MG tablet Take 1 tablet (20 mg total) by mouth daily. 30 tablet 11   Fe Bisgly-Vit C-Vit B12-FA (GENTLE IRON PO) Take by mouth.     ibuprofen (ADVIL) 600 MG tablet Take 1 tablet (600 mg total) by mouth every 6 (six) hours as needed. (Patient not taking: Reported on 07/07/2021) 30 tablet 0   levonorgestrel (LILETTA, 52 MG,) 20.1 MCG/DAY IUD 1 each by Intrauterine route once.     No current facility-administered medications on file prior to visit.    ALLERGIES: Allergies  Allergen Reactions   Other Itching, Swelling and Other (See Comments)    Certain exotic fruits...mango, dragonfruit, papaya,etc. Tongue swells.   Mixed Grasses Itching   Tramadol Hives    FAMILY HISTORY: Family History  Adopted: Yes    Objective:  *** General: No acute distress.  Patient appears well-groomed.   Head:  Normocephalic/atraumatic Eyes:  fundi examined but not visualized Neck: supple, no paraspinal tenderness, full range of motion Back: No paraspinal tenderness Heart: regular rate and rhythm Lungs: Clear to auscultation bilaterally. Vascular: No carotid bruits. Neurological Exam: Mental status: alert and oriented to person, place, and time, recent and remote memory intact, fund of knowledge intact, attention and concentration intact,  speech fluent and not dysarthric, language intact. Cranial nerves: CN I: not tested CN II: pupils equal, round and reactive to light, visual fields intact CN III, IV, VI:   full range of motion, no nystagmus, no ptosis CN V: facial sensation intact. CN VII: upper and lower face symmetric CN VIII: hearing intact CN IX, X: gag intact, uvula midline CN XI: sternocleidomastoid and trapezius muscles intact CN XII: tongue midline Bulk & Tone: normal, no fasciculations. Motor:  muscle strength 5/5 throughout Sensation:  Pinprick, temperature and vibratory sensation intact. Deep Tendon Reflexes:  2+ throughout,  toes downgoing.   Finger to nose testing:  Without dysmetria.   Heel to shin:  Without dysmetria.   Gait:  Normal station and stride.  Romberg negative.    Thank you for allowing me to take part in the care of this patient.  Shon Millet, DO  CC: ***

## 2021-08-19 ENCOUNTER — Ambulatory Visit: Payer: BC Managed Care – PPO | Admitting: Neurology

## 2021-08-20 ENCOUNTER — Encounter: Payer: Self-pay | Admitting: Family

## 2021-08-27 ENCOUNTER — Ambulatory Visit (INDEPENDENT_AMBULATORY_CARE_PROVIDER_SITE_OTHER): Payer: BC Managed Care – PPO | Admitting: Psychiatry

## 2021-08-27 ENCOUNTER — Encounter (HOSPITAL_COMMUNITY): Payer: Self-pay | Admitting: Psychiatry

## 2021-08-27 ENCOUNTER — Other Ambulatory Visit (HOSPITAL_COMMUNITY): Payer: Self-pay | Admitting: Psychiatry

## 2021-08-27 VITALS — BP 120/70 | Temp 97.8°F | Ht <= 58 in | Wt 173.0 lb

## 2021-08-27 DIAGNOSIS — F53 Postpartum depression: Secondary | ICD-10-CM

## 2021-08-27 DIAGNOSIS — Z639 Problem related to primary support group, unspecified: Secondary | ICD-10-CM

## 2021-08-27 DIAGNOSIS — F331 Major depressive disorder, recurrent, moderate: Secondary | ICD-10-CM | POA: Diagnosis not present

## 2021-08-27 MED ORDER — ESCITALOPRAM OXALATE 20 MG PO TABS: 30.0000 mg | ORAL_TABLET | Freq: Every day | ORAL | 0 refills | Status: DC
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
## 2021-08-28 ENCOUNTER — Telehealth (HOSPITAL_COMMUNITY): Payer: Self-pay

## 2021-08-28 DIAGNOSIS — F53 Postpartum depression: Secondary | ICD-10-CM

## 2021-08-28 MED ORDER — ESCITALOPRAM OXALATE 10 MG PO TABS: 10.0000 mg | ORAL_TABLET | Freq: Every day | ORAL | 0 refills | Status: DC

## 2021-08-28 MED ORDER — ESCITALOPRAM OXALATE 20 MG PO TABS: 20.0000 mg | ORAL_TABLET | Freq: Every day | ORAL | 0 refills | Status: DC

## 2021-08-28 NOTE — Telephone Encounter (Signed)
Prior authorization has been submitted. Waiting on reply from insurance

## 2021-08-28 NOTE — Telephone Encounter (Signed)
[employer] denied the prior authorization for 1 and 1/2 tab per day of Lexapro 20mg . It would have to be a 20mg  and a 10mg  sent to the pharmacy. [facility] on in [place]

## 2021-08-31 ENCOUNTER — Telehealth (HOSPITAL_COMMUNITY): Payer: Self-pay

## 2021-08-31 NOTE — Telephone Encounter (Signed)
Prior authorization done for Escitalopram 20mg  Approved thru Covermymeds Expires 08/30/2022 Informed pharmacy by fax

## 2021-08-31 NOTE — Progress Notes (Signed)
NEUROLOGY CONSULTATION NOTE  Michelle Knight MRN: [id number] DOB: May 06, 2001  Referring provider: Gayleen Orem, MD Primary care provider: Gayleen Orem, MD  Reason for consult:  migraines  Assessment/Plan:   Chronic migraine without aura, without status migrainosus, not intractable  Migraine prevention:  start topiramate 25mg  at bedtime.  Increase to 50mg  at bedtime in 4 weeks if needed Migraine rescue:  rizatriptan 10mg  Limit use of pain relievers to no more than 2 days out of week to prevent risk of rebound or medication-overuse headache. Keep headache diary Follow up 4 months    Subjective:  Michelle Knight is a 21 year old female with headaches.  History supplemented by referring provider's note.  Onset:  21 years old Location:  mostly frontal and left greater than left temple Quality:  pressure/throbbing Intensity:  8/10.   Aura:  absent Prodrome:  absent Associated symptoms:  Photophobia, sees dots and blurriness with quick head movements.  Sometimes difficulty taking a breath if severe.  Sometimes nausea.  She denies associated vomiting, phonophobia, unilateral numbness or weakness. Duration:  2-5 hours Frequency:  daily Frequency of abortive medication: none Triggers:  unknown Relieving factors:  sit in dark place, shower Activity:  aggravates  Current NSAIDS/analgesics:  none Current triptans:  none Current ergotamine:  none Current anti-emetic:  none Current muscle relaxants:  none Current Antihypertensive medications:  none Current Antidepressant medications:  Lexapro Current Anticonvulsant medications:  none Current anti-CGRP:  none Current Vitamins/Herbal/Supplements:  none Current Antihistamines/Decongestants:  none Other therapy:  none Hormone/birth control:  levonorgestrel   Past NSAIDS/analgesics:  Ibuprofen, Excedrin, Tylenol Past abortive triptans:  sumatriptan tab Past abortive ergotamine:  none Past muscle relaxants:  none Past  anti-emetic:  none Past antihypertensive medications:  none Past antidepressant medications:  none Past anticonvulsant medications:  none Past anti-CGRP:  none Past vitamins/Herbal/Supplements:  none Past antihistamines/decongestants:  none Other past therapies:  none  Caffeine:  rarely Diet:  5 to 6 bottles water daily.  Does not skip meals.  Tries to eat healthy Exercise:  a little bit Depression:  yes; Anxiety:  no Other pain:  no Sleep hygiene:  good She has a three month old boy.  Not breastfeeding. Family history of headache:  unknown.  Adopted.        PAST MEDICAL HISTORY: Past Medical History:  Diagnosis Date   Anemia    IUFD at 20 weeks or more of gestation     PAST SURGICAL HISTORY: Past Surgical History:  Procedure Laterality Date   WISDOM TOOTH EXTRACTION      MEDICATIONS: Current Outpatient Medications on File Prior to Visit  Medication Sig Dispense Refill   acetaminophen (TYLENOL) 325 MG tablet Take 2 tablets (650 mg total) by mouth every 6 (six) hours as needed (for pain scale < 4). (Patient not taking: Reported on 07/07/2021)     escitalopram (LEXAPRO) 10 MG tablet Take 1 tablet (10 mg total) by mouth daily. This is in addition to 20mg . Total dose is 30mg  30 tablet 0   escitalopram (LEXAPRO) 20 MG tablet Take 1 tablet (20 mg total) by mouth daily. This is in addition to 10mg . Total dose is 30mg . Delete prior refills of lexapro 30 tablet 0   Fe Bisgly-Vit C-Vit B12-FA (GENTLE IRON PO) Take by mouth.     ibuprofen (ADVIL) 600 MG tablet Take 1 tablet (600 mg total) by mouth every 6 (six) hours as needed. (Patient not taking: Reported on 07/07/2021) 30 tablet 0   levonorgestrel (LILETTA, 52 MG,) 20.1  MCG/DAY IUD 1 each by Intrauterine route once.     No current facility-administered medications on file prior to visit.    ALLERGIES: Allergies  Allergen Reactions   Other Itching, Swelling and Other (See Comments)    Certain exotic fruits...mango, dragonfruit,  papaya,etc. Tongue swells.   Mixed Grasses Itching   Tramadol Hives    FAMILY HISTORY: Family History  Adopted: Yes    Objective:  Blood pressure 116/66, pulse 72, height 4\' 9"  (1.448 m), weight 177 lb 6.4 oz (80.5 kg), last menstrual period 09/13/2020, SpO2 97 %, not currently breastfeeding. General: No acute distress.  Patient appears well-groomed.   Head:  Normocephalic/atraumatic Eyes:  fundi examined but not visualized Neck: supple, no paraspinal tenderness, full range of motion Back: No paraspinal tenderness Heart: regular rate and rhythm Lungs: Clear to auscultation bilaterally. Vascular: No carotid bruits. Neurological Exam: Mental status: alert and oriented to person, place, and time, recent and remote memory intact, fund of knowledge intact, attention and concentration intact, speech fluent and not dysarthric, language intact. Cranial nerves: CN I: not tested CN II: pupils equal, round and reactive to light, visual fields intact CN III, IV, VI:  full range of motion, no nystagmus, no ptosis CN V: facial sensation intact. CN VII: upper and lower face symmetric CN VIII: hearing intact CN IX, X: gag intact, uvula midline CN XI: sternocleidomastoid and trapezius muscles intact CN XII: tongue midline Bulk & Tone: normal, no fasciculations. Motor:  muscle strength 5/5 throughout Sensation:  Pinprick, temperature and vibratory sensation intact. Deep Tendon Reflexes:  2+ throughout,  toes downgoing.   Finger to nose testing:  Without dysmetria.   Heel to shin:  Without dysmetria.   Gait:  Normal station and stride.  Romberg negative.    Thank you for allowing me to take part in the care of this patient.  11/13/2020, DO  CC: Shon Millet, MD

## 2021-09-01 ENCOUNTER — Ambulatory Visit (INDEPENDENT_AMBULATORY_CARE_PROVIDER_SITE_OTHER): Payer: BC Managed Care – PPO | Admitting: Neurology

## 2021-09-01 ENCOUNTER — Other Ambulatory Visit: Payer: Self-pay

## 2021-09-01 ENCOUNTER — Encounter: Payer: Self-pay | Admitting: Neurology

## 2021-09-01 VITALS — BP 116/66 | HR 72 | Ht <= 58 in | Wt 177.4 lb

## 2021-09-01 DIAGNOSIS — G43709 Chronic migraine without aura, not intractable, without status migrainosus: Secondary | ICD-10-CM | POA: Diagnosis not present

## 2021-09-01 MED ORDER — RIZATRIPTAN BENZOATE 10 MG PO TABS: 10.0000 mg | ORAL_TABLET | ORAL | 3 refills | Status: DC | PRN

## 2021-09-01 MED ORDER — TOPIRAMATE 25 MG PO TABS: 25.0000 mg | ORAL_TABLET | Freq: Every day | ORAL | 3 refills | Status: DC

## 2021-09-01 NOTE — Patient Instructions (Signed)
°  Start topiramate 25mg  at bedtime.  Contact in 4 weeks with update and we can increase dose if needed. Take rizatriptan 10mg  at earliest onset of headache.  May repeat dose once in 2 hours if needed.  Maximum 2 tablets in 24 hours. Limit use of pain relievers to no more than 2 days out of the week.  These medications include acetaminophen, NSAIDs (ibuprofen/Advil/Motrin, naproxen/Aleve, triptans (Imitrex/sumatriptan), Excedrin, and narcotics.  This will help reduce risk of rebound headaches. Be aware of common food triggers:  - Caffeine:  coffee, black tea, cola, Mt. Dew  - Chocolate  - Dairy:  aged cheeses (brie, blue, cheddar, gouda, [place], provolone, [place], Swiss, etc), chocolate milk, buttermilk, sour cream, limit eggs and yogurt  - Nuts, peanut butter  - Alcohol  - Cereals/grains:  FRESH breads (fresh bagels, sourdough, doughnuts), yeast productions  - Processed/canned/aged/cured meats (pre-packaged deli meats, hotdogs)  - MSG/glutamate:  soy sauce, flavor enhancer, pickled/preserved/marinated foods  - Sweeteners:  aspartame (Equal, Nutrasweet).  Sugar and Splenda are okay  - Vegetables:  legumes (lima beans, lentils, snow peas, fava beans, pinto peans, peas, garbanzo beans), sauerkraut, onions, olives, pickles  - Fruit:  avocados, bananas, citrus fruit (orange, lemon, grapefruit), mango  - Other:  Frozen meals, macaroni and cheese Routine exercise Stay adequately hydrated (aim for 64 oz water daily) Keep headache diary Maintain proper stress management Maintain proper sleep hygiene Do not skip meals Consider supplements:  magnesium citrate 400mg  daily, riboflavin 400mg  daily, coenzyme Q10 100mg  three times daily.

## 2021-09-02 ENCOUNTER — Encounter: Payer: Self-pay | Admitting: Family

## 2021-10-01 ENCOUNTER — Encounter (HOSPITAL_COMMUNITY): Payer: Self-pay | Admitting: Psychiatry

## 2021-10-01 ENCOUNTER — Ambulatory Visit (INDEPENDENT_AMBULATORY_CARE_PROVIDER_SITE_OTHER): Payer: BC Managed Care – PPO | Admitting: Psychiatry

## 2021-10-01 VITALS — BP 120/82 | Temp 98.6°F | Ht <= 58 in | Wt 180.0 lb

## 2021-10-01 DIAGNOSIS — F53 Postpartum depression: Secondary | ICD-10-CM

## 2021-10-01 DIAGNOSIS — F331 Major depressive disorder, recurrent, moderate: Secondary | ICD-10-CM

## 2021-10-01 MED ORDER — ESCITALOPRAM OXALATE 20 MG PO TABS: 20.0000 mg | ORAL_TABLET | Freq: Every day | ORAL | 1 refills | Status: DC

## 2021-10-01 MED ORDER — ESCITALOPRAM OXALATE 10 MG PO TABS: 10.0000 mg | ORAL_TABLET | Freq: Every day | ORAL | 1 refills | Status: DC
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
## 2021-11-17 ENCOUNTER — Ambulatory Visit: Payer: BC Managed Care – PPO | Admitting: Neurology

## 2021-11-19 ENCOUNTER — Telehealth (HOSPITAL_COMMUNITY): Payer: Self-pay | Admitting: Psychiatry

## 2021-11-19 NOTE — Telephone Encounter (Signed)
Provider on [facility] 12/31/21 clld pt to reschedule appt. No ans & VM is full. Appt cncld

## 2021-11-25 ENCOUNTER — Emergency Department (INDEPENDENT_AMBULATORY_CARE_PROVIDER_SITE_OTHER): Payer: BC Managed Care – PPO

## 2021-11-25 ENCOUNTER — Emergency Department (INDEPENDENT_AMBULATORY_CARE_PROVIDER_SITE_OTHER)
Admission: EM | Admit: 2021-11-25 | Discharge: 2021-11-25 | Disposition: A | Discharge status: Home / Self Care | Payer: BC Managed Care – PPO | Source: Home / Self Care

## 2021-11-25 DIAGNOSIS — R079 Chest pain, unspecified: Secondary | ICD-10-CM

## 2021-11-25 DIAGNOSIS — R0602 Shortness of breath: Secondary | ICD-10-CM

## 2021-11-25 MED ORDER — METHYLPREDNISOLONE 4 MG PO TBPK: ORAL_TABLET | ORAL | 0 refills | Status: DC

## 2021-11-25 NOTE — Discharge Instructions (Addendum)
Advised/informed patient of chest x-ray results with hard copy provided to patient.  Instructed patient to take medication as directed with food to completion.  Encouraged patient to  increase daily water intake while taking this medication.  Advised patient if symptoms worsen and/or unresolved please follow-up with PCP or here for further evaluation.

## 2021-11-25 NOTE — ED Provider Notes (Signed)
[facility]    CSN: [id number] Arrival date & time: 11/25/21  1138      History   Chief Complaint Chief Complaint  Patient presents with   Shortness of Breath    HPI Michelle Knight is a 21 y.o. female.   HPI Pleasant 21 year old female presents with shortness of breath for 3 days.  PMH significant for iron deficiency anemia and obesity.  Past Medical History:  Diagnosis Date   Anemia    IUFD at 38 weeks or more of gestation     Patient Active Problem List   Diagnosis Date Noted   Postpartum depression 06/16/2021   Vaginal delivery 06/09/2021   IDA (iron deficiency anemia) 05/05/2021   History of IUFD 11/11/2020   History of gestational diabetes 11/11/2020   History of anxiety 11/11/2020   Depression 11/11/2020    Past Surgical History:  Procedure Laterality Date   WISDOM TOOTH EXTRACTION      OB History     Gravida  4   Para  2   Term  2   Preterm      AB  2   Living  1      SAB  2   IAB      Ectopic      Multiple  0   Live Births  1            Home Medications    Prior to Admission medications   Medication Sig Start Date End Date Taking? Authorizing Provider  methylPREDNISolone (MEDROL DOSEPAK) 4 MG TBPK tablet Take as directed 11/25/21  Yes Trevor Ihaagan, Denna Fryberger, FNP  escitalopram (LEXAPRO) 10 MG tablet Take 1 tablet (10 mg total) by mouth daily. This is in addition to 20mg . Total dose is 30mg  Patient not taking: Reported on 11/25/2021 10/01/21   Thresa RossAkhtar, Nadeem, MD  escitalopram (LEXAPRO) 20 MG tablet Take 1 tablet (20 mg total) by mouth daily. This is in addition to 10mg . Total dose is 30mg . Delete prior refills of lexapro 10/01/21   Thresa RossAkhtar, Nadeem, MD  Fe Bisgly-Vit C-Vit B12-FA (GENTLE IRON PO) Take by mouth.    [provider]  levonorgestrel (LILETTA, 52 MG,) 20.1 MCG/DAY IUD 1 each by Intrauterine route once.    [provider]  rizatriptan (MAXALT) 10 MG tablet Take 1 tablet (10 mg total) by mouth as  needed for migraine (May repeat in 2 hours.  maximum 2 tablets in 24 hours.). May repeat in 2 hours if needed 09/01/21   Drema DallasJaffe, Adam R, DO  topiramate (TOPAMAX) 25 MG tablet Take 1 tablet (25 mg total) by mouth at bedtime. 09/01/21   Drema DallasJaffe, Adam R, DO    Family History Family History  Adopted: Yes    Social History Social History   Tobacco Use   Smoking status: Never   Smokeless tobacco: Never  Vaping Use   Vaping Use: Never used  Substance Use Topics   Alcohol use: Not Currently   Drug use: Not Currently    Types: Marijuana    Comment: last use oct 2021     Allergies   Other, Mixed grasses, and Tramadol   Review of Systems Review of Systems  Respiratory:  Positive for shortness of breath.   All other systems reviewed and are negative.   Physical Exam Triage Vital Signs ED Triage Vitals  Enc Vitals Group     BP 11/25/21 1146 117/78     Pulse Rate 11/25/21 1146 74     Resp 11/25/21 1146  16     Temp 11/25/21 1146 98.7 F (37.1 C)     Temp Source 11/25/21 1146 Oral     SpO2 11/25/21 1146 97 %     Weight --      Height --      Head Circumference --      Peak Flow --      Pain Score 11/25/21 1149 4     Pain Loc --      Pain Edu? --      Excl. in GC? --    No data found.  Updated Vital Signs BP 117/78 (BP Location: Right Arm)   Pulse 74   Temp 98.7 F (37.1 C) (Oral)   Resp 16   SpO2 97%    Physical Exam Vitals and nursing note reviewed.  Constitutional:      Appearance: Normal appearance. She is obese. She is not ill-appearing.  HENT:     Head: Normocephalic and atraumatic.     Right Ear: Tympanic membrane, ear canal and external ear normal.     Left Ear: Tympanic membrane, ear canal and external ear normal.     Mouth/Throat:     Mouth: Mucous membranes are moist.     Pharynx: Oropharynx is clear.  Eyes:     Extraocular Movements: Extraocular movements intact.     Conjunctiva/sclera: Conjunctivae normal.     Pupils: Pupils are equal, round, and  reactive to light.  Cardiovascular:     Rate and Rhythm: Normal rate and regular rhythm.     Pulses: Normal pulses.     Heart sounds: Normal heart sounds.  Pulmonary:     Effort: Pulmonary effort is normal.     Breath sounds: No wheezing, rhonchi or rales.     Comments: Diminished breath sounds bibasilarly Musculoskeletal:     Cervical back: Normal range of motion and neck supple.  Skin:    General: Skin is warm and dry.  Neurological:     General: No focal deficit present.     Mental Status: She is alert and oriented to person, place, and time. Mental status is at baseline.     UC Treatments / Results  Labs (all labs ordered are listed, but only abnormal results are displayed) Labs Reviewed - No data to display  EKG   Radiology DG Chest 2 View  Result Date: 11/25/2021 CLINICAL DATA:  Shortness of breath, chest pain EXAM: CHEST - 2 VIEW COMPARISON:  None Available. FINDINGS: The heart size and mediastinal contours are within normal limits. Both lungs are clear. The visualized skeletal structures are unremarkable. IMPRESSION: No acute abnormality of the lungs. Electronically Signed   By: Jearld Lesch M.D.   On: 11/25/2021 13:01    Procedures Procedures (including critical care time)  Medications Ordered in UC Medications - No data to display  Initial Impression / Assessment and Plan / UC Course  I have reviewed the triage vital signs and the nursing notes.  Pertinent labs & imaging results that were available during my care of the patient were reviewed by me and considered in my medical decision making (see chart for details).     MDM: 1.  Shortness of breath-CXR revealed no acute abnormality of the lungs. Advised/informed patient of chest x-ray results with hard copy provided to patient.  Instructed patient to take medication as directed with food to completion.  Encouraged patient to  increase daily water intake while taking this medication.  Advised patient if symptoms  worsen and/or  unresolved please follow-up with PCP or here for further evaluation.  Work note provided to patient per request.  Patient discharged home, hemodynamically stable. Final Clinical Impressions(s) / UC Diagnoses   Final diagnoses:  SOB (shortness of breath)     Discharge Instructions      Advised/informed patient of chest x-ray results with hard copy provided to patient.  Instructed patient to take medication as directed with food to completion.  Encouraged patient to  increase daily water intake while taking this medication.  Advised patient if symptoms worsen and/or unresolved please follow-up with PCP or here for further evaluation.     ED Prescriptions     Medication Sig Dispense Auth. Provider   methylPREDNISolone (MEDROL DOSEPAK) 4 MG TBPK tablet Take as directed 1 each Trevor Iha, FNP      PDMP not reviewed this encounter.   Trevor Iha, FNP 11/25/21 1321

## 2021-11-25 NOTE — ED Triage Notes (Signed)
Pt presents with c/o SOB and cough that began Sunday.

## 2021-12-23 ENCOUNTER — Telehealth: Payer: Self-pay | Admitting: Neurology

## 2021-12-23 NOTE — Telephone Encounter (Signed)
Patient called and said her rizatriptan. She had a baby earlier this year and forgot to call and give an update to the clinical staff, she'd like a call back.  [facility] in [place]

## 2021-12-24 MED ORDER — RIZATRIPTAN BENZOATE 10 MG PO TABS: 10.0000 mg | ORAL_TABLET | ORAL | 2 refills | Status: DC | PRN

## 2021-12-24 NOTE — Telephone Encounter (Signed)
Per Patient needs a refill on Rizatriptan.   Refills sent.

## 2021-12-28 ENCOUNTER — Encounter: Payer: Self-pay | Admitting: Family

## 2021-12-31 ENCOUNTER — Ambulatory Visit (HOSPITAL_COMMUNITY): Payer: BC Managed Care – PPO | Admitting: Psychiatry

## 2022-01-04 ENCOUNTER — Inpatient Hospital Stay (HOSPITAL_BASED_OUTPATIENT_CLINIC_OR_DEPARTMENT_OTHER): Payer: BC Managed Care – PPO | Admitting: Family

## 2022-01-04 ENCOUNTER — Inpatient Hospital Stay: Payer: BC Managed Care – PPO | Attending: Hematology & Oncology

## 2022-01-04 ENCOUNTER — Encounter: Payer: Self-pay | Admitting: Family

## 2022-01-04 VITALS — BP 102/56 | HR 80 | Temp 98.8°F | Resp 17 | Wt 195.1 lb

## 2022-01-04 DIAGNOSIS — Z885 Allergy status to narcotic agent status: Secondary | ICD-10-CM | POA: Insufficient documentation

## 2022-01-04 DIAGNOSIS — R200 Anesthesia of skin: Secondary | ICD-10-CM | POA: Diagnosis not present

## 2022-01-04 DIAGNOSIS — Z79899 Other long term (current) drug therapy: Secondary | ICD-10-CM | POA: Diagnosis not present

## 2022-01-04 DIAGNOSIS — D509 Iron deficiency anemia, unspecified: Secondary | ICD-10-CM | POA: Diagnosis not present

## 2022-01-04 DIAGNOSIS — R140 Abdominal distension (gaseous): Secondary | ICD-10-CM | POA: Diagnosis not present

## 2022-01-04 DIAGNOSIS — R5383 Other fatigue: Secondary | ICD-10-CM | POA: Diagnosis not present

## 2022-01-04 DIAGNOSIS — R42 Dizziness and giddiness: Secondary | ICD-10-CM | POA: Diagnosis not present

## 2022-01-04 DIAGNOSIS — R202 Paresthesia of skin: Secondary | ICD-10-CM | POA: Insufficient documentation

## 2022-01-04 DIAGNOSIS — Z888 Allergy status to other drugs, medicaments and biological substances status: Secondary | ICD-10-CM | POA: Diagnosis not present

## 2022-01-04 LAB — CBC WITH DIFFERENTIAL (CANCER CENTER ONLY)
Abs Immature Granulocytes: 0.08 10*3/uL — ABNORMAL HIGH (ref 0.00–0.07)
Basophils Absolute: 0.1 10*3/uL (ref 0.0–0.1)
Basophils Relative: 1 %
Eosinophils Absolute: 0.1 10*3/uL (ref 0.0–0.5)
Eosinophils Relative: 1 %
HCT: 43 % (ref 36.0–46.0)
Hemoglobin: 14.1 g/dL (ref 12.0–15.0)
Immature Granulocytes: 1 %
Lymphocytes Relative: 25 %
Lymphs Abs: 2.5 10*3/uL (ref 0.7–4.0)
MCH: 28 pg (ref 26.0–34.0)
MCHC: 32.8 g/dL (ref 30.0–36.0)
MCV: 85.3 fL (ref 80.0–100.0)
Monocytes Absolute: 0.7 10*3/uL (ref 0.1–1.0)
Monocytes Relative: 7 %
Neutro Abs: 6.5 10*3/uL (ref 1.7–7.7)
Neutrophils Relative %: 65 %
Platelet Count: 262 10*3/uL (ref 150–400)
RBC: 5.04 MIL/uL (ref 3.87–5.11)
RDW: 13 % (ref 11.5–15.5)
WBC Count: 9.9 10*3/uL (ref 4.0–10.5)
nRBC: 0 % (ref 0.0–0.2)

## 2022-01-04 LAB — IRON AND IRON BINDING CAPACITY (CC-WL,HP ONLY)
Iron: 107 ug/dL (ref 28–170)
Saturation Ratios: 24 % (ref 10.4–31.8)
TIBC: 441 ug/dL (ref 250–450)
UIBC: 334 ug/dL (ref 148–442)

## 2022-01-04 LAB — RETICULOCYTES
Immature Retic Fract: 18.3 % — ABNORMAL HIGH (ref 2.3–15.9)
RBC.: 4.96 MIL/uL (ref 3.87–5.11)
Retic Count, Absolute: 104.2 10*3/uL (ref 19.0–186.0)
Retic Ct Pct: 2.1 % (ref 0.4–3.1)

## 2022-01-04 LAB — FERRITIN: Ferritin: 26 ng/mL (ref 11–307)

## 2022-01-04 NOTE — Progress Notes (Signed)
Hematology and Oncology Follow Up Visit  Michelle Knight [id number] [birth year] 21 y.o. 01/04/2022   Principle Diagnosis:  Iron deficiency anemia    Current Therapy:        PO iron daily during pregnancy IV iron when indicated    Interim History:  Michelle Knight is here today for follow-up. She is doing well but does note some fatigue and chews lots of ice.  She had her beautiful baby boy in December 2022 and he will be 54 months old this week.  She had an IUD placed after delivery and states that her cycle has been more regular and flow has been light.  No other blood loss noted. No bruising or petechiae.  She has occasional abdominal bloating.  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  No falls or syncope reported.  No fever, chills, n/v cough, rash, SOB, chest pain, palpitations, abdominal pain or changes in bowel or bladder habits.  No swelling in her extremities.  She does note intermittent numbness and tingling in her arms. This comes and goes.  Appetite and hydration have been good. Her weight is 195 lbs.   ECOG Performance Status: 1 - Symptomatic but completely ambulatory  Medications:  Allergies as of 01/04/2022       Reactions   Other Itching, Swelling, Other (See Comments)   Certain exotic fruits...mango, dragonfruit, papaya,etc. Tongue swells.   Mixed Grasses Itching   Tramadol Hives        Medication List        Accurate as of January 04, 2022 10:30 AM. If you have any questions, ask your nurse or doctor.          escitalopram 20 MG tablet Commonly known as: Lexapro Take 1 tablet (20 mg total) by mouth daily. This is in addition to 10mg . Total dose is 30mg . Delete prior refills of lexapro   escitalopram 10 MG tablet Commonly known as: Lexapro Take 1 tablet (10 mg total) by mouth daily. This is in addition to 20mg . Total dose is 30mg    GENTLE IRON PO Take by mouth.   Liletta (52 MG) 20.1 MCG/DAY  Iud IUD Generic drug: levonorgestrel 1 each by Intrauterine route once.   methylPREDNISolone 4 MG Tbpk tablet Commonly known as: MEDROL DOSEPAK Take as directed   rizatriptan 10 MG tablet Commonly known as: Maxalt Take 1 tablet (10 mg total) by mouth as needed for migraine (May repeat in 2 hours.  maximum 2 tablets in 24 hours.). May repeat in 2 hours if needed   topiramate 25 MG tablet Commonly known as: TOPAMAX Take 1 tablet (25 mg total) by mouth at bedtime.        Allergies:  Allergies  Allergen Reactions   Other Itching, Swelling and Other (See Comments)    Certain exotic fruits...mango, dragonfruit, papaya,etc. Tongue swells.   Mixed Grasses Itching   Tramadol Hives    Past Medical History, Surgical history, Social history, and Family History were reviewed and updated.  Review of Systems: All other 10 point review of systems is negative.   Physical Exam:  vitals were not taken for this visit.   Wt Readings from Last 3 Encounters:  09/01/21 177 lb 6.4 oz (80.5 kg)  07/28/21 167 lb (75.8 kg)  07/07/21 162 lb (73.5 kg)    Ocular: Sclerae unicteric, pupils equal, round and reactive to light Ear-nose-throat: Oropharynx clear, dentition fair Lymphatic: No cervical or supraclavicular adenopathy Lungs no  rales or rhonchi, good excursion bilaterally Heart regular rate and rhythm, no murmur appreciated Abd soft, nontender, positive bowel sounds MSK no focal spinal tenderness, no joint edema Neuro: non-focal, well-oriented, appropriate affect Breasts: Deferred   Lab Results  Component Value Date   WBC 8.0 06/08/2021   HGB 11.8 (L) 06/08/2021   HCT 36.0 06/08/2021   MCV 82.2 06/08/2021   PLT 218 06/08/2021   Lab Results  Component Value Date   FERRITIN 6 (L) 05/04/2021   IRON 62 05/04/2021   TIBC 549 (H) 05/04/2021   UIBC 487 (H) 05/04/2021   IRONPCTSAT 11 (L) 05/04/2021   Lab Results  Component Value Date   RETICCTPCT 2.0 05/04/2021   RBC 4.38  06/08/2021   No results found for: "KPAFRELGTCHN", "LAMBDASER", "KAPLAMBRATIO" No results found for: "IGGSERUM", "IGA", "IGMSERUM" No results found for: "TOTALPROTELP", "ALBUMINELP", "A1GS", "A2GS", "BETS", "BETA2SER", "GAMS", "MSPIKE", "SPEI"   Chemistry      Component Value Date/Time   NA 138 04/29/2020 1342   K 3.8 04/29/2020 1342   CL 104 04/29/2020 1342   CO2 26 04/29/2020 1342   BUN 9 04/29/2020 1342   CREATININE 0.61 04/29/2020 1342      Component Value Date/Time   CALCIUM 9.5 04/29/2020 1342   ALKPHOS 89 04/29/2020 1342   AST 14 (L) 04/29/2020 1342   ALT 12 04/29/2020 1342   BILITOT 0.3 04/29/2020 1342       Impression and Plan: Michelle Knight is a very pleasant 21 yo female with iron deficiency anemia.   Iron studies are pending.  Follow-up as needed.   Eileen Stanford, NP 7/3/202310:30 AM

## 2022-01-06 ENCOUNTER — Encounter: Payer: Self-pay | Admitting: *Deleted

## 2022-01-08 ENCOUNTER — Encounter (HOSPITAL_COMMUNITY): Payer: Self-pay | Admitting: Psychiatry

## 2022-01-08 ENCOUNTER — Telehealth (INDEPENDENT_AMBULATORY_CARE_PROVIDER_SITE_OTHER): Payer: BC Managed Care – PPO | Admitting: Psychiatry

## 2022-01-08 ENCOUNTER — Telehealth: Payer: Self-pay | Admitting: *Deleted

## 2022-01-08 DIAGNOSIS — F53 Postpartum depression: Secondary | ICD-10-CM

## 2022-01-08 DIAGNOSIS — Z639 Problem related to primary support group, unspecified: Secondary | ICD-10-CM

## 2022-01-08 DIAGNOSIS — F331 Major depressive disorder, recurrent, moderate: Secondary | ICD-10-CM

## 2022-01-08 MED ORDER — ESCITALOPRAM OXALATE 20 MG PO TABS: 20.0000 mg | ORAL_TABLET | Freq: Every day | ORAL | 1 refills | Status: DC

## 2022-01-08 MED ORDER — ESCITALOPRAM OXALATE 10 MG PO TABS: 10.0000 mg | ORAL_TABLET | Freq: Every day | ORAL | 1 refills | Status: DC

## 2022-01-08 NOTE — Telephone Encounter (Signed)
Per 01/04/22 los - follow up as needed
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
## 2022-03-01 ENCOUNTER — Ambulatory Visit: Payer: Medicaid Other | Admitting: Neurology

## 2022-03-22 ENCOUNTER — Encounter: Payer: Self-pay | Admitting: Family

## 2022-03-24 NOTE — Progress Notes (Signed)
NEUROLOGY FOLLOW UP OFFICE NOTE  Undrea Ganzer [id number]  Assessment/Plan:   Chronic migraine without aura, without status migrainosus, not intractable   Migraine prevention:  increase topiramate to 50mg  at bedtime Migraine rescue:  rizatriptan 10mg .  Will have her try Nurtec to take at work as it less likely may cause drowsiness.  She will let me know if effective.  Otherwise, we can try Ubrelvy Limit use of pain relievers to no more than 2 days out of week to prevent risk of rebound or medication-overuse headache. Keep headache diary Follow up 6 months       Subjective:  Michelle Knight is a 21 year old female who follows up for migraines.  UPDATE: Prescribed topiramate in February.  Improved Intensity:  severe Duration:  5-10 minutes with rizatriptan.   Frequency:  5-6 a month (4 are severe) Current NSAIDS/analgesics:  none Current triptans:  rizatriptan 10mg  (effective but makes her sleepy - cannot take at work) Current ergotamine:  none Current anti-emetic:  none Current muscle relaxants:  none Current Antihypertensive medications:  none Current Antidepressant medications:  Lexapro Current Anticonvulsant medications:  topiramate 25mg  QHS Current anti-CGRP:  none Current Vitamins/Herbal/Supplements:  none Current Antihistamines/Decongestants:  none Other therapy:  none Hormone/birth control:  levonorgestrel  Caffeine:  rarely Diet:  5 to 6 bottles water daily.  Does not skip meals.  Tries to eat healthy Exercise:  a little bit Depression:  yes; Anxiety:  no Other pain:  no Sleep hygiene:  good   HISTORY:  Onset:  21 years old Location:  mostly frontal and left greater than left temple Quality:  pressure/throbbing Intensity:  8/10.   Aura:  absent Prodrome:  absent Associated symptoms:  Photophobia, sees dots and blurriness with quick head movements.  Sometimes difficulty taking a breath if severe.  Sometimes nausea.  She denies associated vomiting,  phonophobia, unilateral numbness or weakness. Duration:  2-5 hours Frequency:  daily Frequency of abortive medication: none Triggers:  unknown Relieving factors:  sit in dark place, shower Activity:  aggravates     Past NSAIDS/analgesics:  Ibuprofen, Excedrin, Tylenol Past abortive triptans:  sumatriptan tab Past abortive ergotamine:  none Past muscle relaxants:  none Past anti-emetic:  none Past antihypertensive medications:  none Past antidepressant medications:  none Past anticonvulsant medications:  none Past anti-CGRP:  none Past vitamins/Herbal/Supplements:  none Past antihistamines/decongestants:  none Other past therapies:  none    Family history of headache:  unknown.  Adopted.    PAST MEDICAL HISTORY: Past Medical History:  Diagnosis Date   Anemia    IUFD at 61 weeks or more of gestation     MEDICATIONS: Current Outpatient Medications on File Prior to Visit  Medication Sig Dispense Refill   escitalopram (LEXAPRO) 10 MG tablet Take 1 tablet (10 mg total) by mouth daily. This is in addition to 20mg . Total dose is 30mg  30 tablet 1   escitalopram (LEXAPRO) 20 MG tablet Take 1 tablet (20 mg total) by mouth daily. This is in addition to 10mg . Total dose is 30mg . Delete prior refills of lexapro 30 tablet 1   Fe Bisgly-Vit C-Vit B12-FA (GENTLE IRON PO) Take by mouth.     levonorgestrel (LILETTA, 52 MG,) 20.1 MCG/DAY IUD 1 each by Intrauterine route once.     methylPREDNISolone (MEDROL DOSEPAK) 4 MG TBPK tablet Take as directed 1 each 0   rizatriptan (MAXALT) 10 MG tablet Take 1 tablet (10 mg total) by mouth as needed for migraine (May repeat in 2 hours.  maximum 2 tablets in 24 hours.). May repeat in 2 hours if needed 10 tablet 2   topiramate (TOPAMAX) 25 MG tablet Take 1 tablet (25 mg total) by mouth at bedtime. 30 tablet 3   No current facility-administered medications on file prior to visit.    ALLERGIES: Allergies  Allergen Reactions   Other Itching, Swelling  and Other (See Comments)    Certain exotic fruits...mango, dragonfruit, papaya,etc. Tongue swells.   Mixed Grasses Itching   Tramadol Hives    FAMILY HISTORY: Family History  Adopted: Yes      Objective:  Blood pressure 123/86, pulse 84, resp. rate 20, height 4\' 9"  (1.448 m), weight 203 lb (92.1 kg), SpO2 98 %, not currently breastfeeding. General: No acute distress.  Patient appears well-groomed.   Head:  Normocephalic/atraumatic Eyes:  Fundi examined but not visualized Neck: supple, no paraspinal tenderness, full range of motion Heart:  Regular rate and rhythm Lungs:  Clear to auscultation bilaterally Back: No paraspinal tenderness Neurological Exam: alert and oriented to person, place, and time.  Speech fluent and not dysarthric, language intact.  CN II-XII intact. Bulk and tone normal, muscle strength 5/5 throughout.  Sensation to light touch intact.  Deep tendon reflexes 2+ throughout, toes downgoing.  Finger to nose testing intact.  Gait normal, Romberg negative.   Metta Clines, DO  CC: Danice Goltz, MD

## 2022-03-26 ENCOUNTER — Encounter: Payer: Self-pay | Admitting: Neurology

## 2022-03-26 ENCOUNTER — Ambulatory Visit (INDEPENDENT_AMBULATORY_CARE_PROVIDER_SITE_OTHER): Payer: BC Managed Care – PPO | Admitting: Neurology

## 2022-03-26 VITALS — BP 123/86 | HR 84 | Resp 20 | Ht <= 58 in | Wt 203.0 lb

## 2022-03-26 DIAGNOSIS — G43009 Migraine without aura, not intractable, without status migrainosus: Secondary | ICD-10-CM | POA: Diagnosis not present

## 2022-03-26 MED ORDER — TOPIRAMATE 50 MG PO TABS: 50.0000 mg | ORAL_TABLET | Freq: Every day | ORAL | 5 refills | Status: DC

## 2022-03-26 NOTE — Progress Notes (Signed)
Medication Samples have been provided to the patient.  Drug name: Nurtec       Strength: 75 mg        Qty: 2   LOT: [id number]  Exp.Date: 03/2024  Dosing instructions: as needed  The patient has been instructed regarding the correct time, dose, and frequency of taking this medication, including desired effects and most common side effects.   Venetia Night 2:30 PM 03/26/2022

## 2022-03-26 NOTE — Patient Instructions (Signed)
Increase topiramate to 50mg  at bedtime.  If no improvement in 6 weeks, contact me Continue rizatriptan as needed.  Try taking Nurtec for migraine when at work (maximum 1 in 24 hours).  Let me know if it works Follow up 6 months.

## 2022-04-13 ENCOUNTER — Encounter: Payer: Self-pay | Admitting: Family

## 2022-05-05 ENCOUNTER — Telehealth (INDEPENDENT_AMBULATORY_CARE_PROVIDER_SITE_OTHER): Payer: BC Managed Care – PPO | Admitting: Psychiatry

## 2022-05-05 ENCOUNTER — Encounter (HOSPITAL_COMMUNITY): Payer: Self-pay | Admitting: Psychiatry

## 2022-05-05 DIAGNOSIS — Z639 Problem related to primary support group, unspecified: Secondary | ICD-10-CM

## 2022-05-05 DIAGNOSIS — F331 Major depressive disorder, recurrent, moderate: Secondary | ICD-10-CM | POA: Diagnosis not present

## 2022-05-05 DIAGNOSIS — F53 Postpartum depression: Secondary | ICD-10-CM

## 2022-05-05 MED ORDER — ESCITALOPRAM OXALATE 20 MG PO TABS
20.0000 mg | ORAL_TABLET | Freq: Every day | ORAL | 1 refills | Status: DC
Start: 2022-05-05 — End: 2022-08-27

## 2022-05-05 MED ORDER — ESCITALOPRAM OXALATE 10 MG PO TABS: 10.0000 mg | ORAL_TABLET | Freq: Every day | ORAL | 1 refills | Status: DC

## 2022-05-05 NOTE — Progress Notes (Signed)
[facility] Follow up visit   Patient Identification: Michelle Knight MRN:  [id number] Date of Evaluation:  05/05/2022 Referral Source: primary care Chief Complaint:   No chief complaint on file. Follow up depression Visit Diagnosis:    ICD-10-CM   1. Postpartum depression  F53.0 escitalopram (LEXAPRO) 20 MG tablet    2. MDD (major depressive disorder), recurrent episode, moderate (HCC)  F33.1     3. Relationship dysfunction  Z63.9      Virtual Visit via Video Note  I connected with Michelle Knight on 05/05/22 at 10:00 AM EDT by a video enabled telemedicine application and verified that I am speaking with the correct person using two identifiers.  Location: Patient: home Provider: home office   I discussed the limitations of evaluation and management by telemedicine and the availability of in person appointments. The patient expressed understanding and agreed to proceed.      I discussed the assessment and treatment plan with the patient. The patient was provided an opportunity to ask questions and all were answered. The patient agreed with the plan and demonstrated an understanding of the instructions.   The patient was advised to call back or seek an in-person evaluation if the symptoms worsen or if the condition fails to improve as anticipated.  I provided 15 minutes of non-face-to-face time during this encounter.   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  She has felt her mom and siblings did not understand what she has gone through at that time.  Therapy helps with stressors Aggravating factors relationship concerns with her  adopted family.  Postpartum. financial  Modifying factors; cousin, baby   Severity improved    Past Psychiatric History: depression, anxiety  Previous Psychotropic Medications: Yes   Substance Abuse History in the last 12 months:  Yes.    Consequences of Substance Abuse: Sporadic use of alcohol, idsucssed its effect on depression  Past Medical History:  Past Medical History:  Diagnosis Date   Anemia    IUFD at 38 weeks or more of gestation     Past Surgical History:  Procedure Laterality Date   WISDOM TOOTH EXTRACTION      Family Psychiatric History: denies, was adapted  Family History:  Family History  Adopted: Yes    Social History:   Social History   Socioeconomic History   Marital status: Single    Spouse name: Not on file   Number of children: 1   Years of education: 12   Highest education level: Not on file  Occupational History   Not on file  Tobacco Use   Smoking status: Never   Smokeless tobacco: Never  Vaping Use   Vaping Use: Never used  Substance and Sexual Activity   Alcohol use: Not Currently   Drug use: Not Currently    Types: Marijuana    Comment: last use oct 2021   Sexual activity: Yes    Birth control/protection: None  Other Topics Concern   Not on file  Social History Narrative   Right handed   Some caffeine   Two story home   Social Determinants  of Health   Financial Resource Strain: Not on file  Food Insecurity: Not on file  Transportation Needs: Not on file  Physical Activity: Not on file  Stress: Not on file  Social Connections: Not on file      Allergies:   Allergies  Allergen Reactions   Other Itching, Swelling and Other (See Comments)    Certain exotic fruits...mango, dragonfruit, papaya,etc. Tongue swells.   Mixed Grasses Itching   Tramadol Hives    Metabolic Disorder Labs: Lab Results  Component Value Date   HGBA1C 5.3 11/11/2020   MPG 105 11/11/2020   No results found for: "PROLACTIN" No  results found for: "CHOL", "TRIG", "HDL", "CHOLHDL", "VLDL", "LDLCALC" No results found for: "TSH"  Therapeutic Level Labs: No results found for: "LITHIUM" No results found for: "CBMZ" No results found for: "VALPROATE"  Current Medications: Current Outpatient Medications  Medication Sig Dispense Refill   escitalopram (LEXAPRO) 10 MG tablet Take 1 tablet (10 mg total) by mouth daily. This is in addition to 20mg . Total dose is 30mg  30 tablet 1   escitalopram (LEXAPRO) 20 MG tablet Take 1 tablet (20 mg total) by mouth daily. This is in addition to 10mg . Total dose is 30mg . Delete prior refills of lexapro 30 tablet 1   Fe Bisgly-Vit C-Vit B12-FA (GENTLE IRON PO) Take by mouth.     levonorgestrel (LILETTA, 52 MG,) 20.1 MCG/DAY IUD 1 each by Intrauterine route once.     methylPREDNISolone (MEDROL DOSEPAK) 4 MG TBPK tablet Take as directed (Patient not taking: Reported on 03/26/2022) 1 each 0   rizatriptan (MAXALT) 10 MG tablet Take 1 tablet (10 mg total) by mouth as needed for migraine (May repeat in 2 hours.  maximum 2 tablets in 24 hours.). May repeat in 2 hours if needed 10 tablet 2   topiramate (TOPAMAX) 50 MG tablet Take 1 tablet (50 mg total) by mouth at bedtime. 30 tablet 5   No current facility-administered medications for this visit.     Psychiatric Specialty Exam: Review of Systems  Cardiovascular:  Negative for chest pain.  Neurological:  Negative for tremors.  Psychiatric/Behavioral:  Negative for agitation and dysphoric mood.     not currently breastfeeding.There is no height or weight on file to calculate BMI.  General Appearance: Casual  Eye Contact:  Fair  Speech:  Clear and Coherent  Volume:  Decreased  Mood:  fair  Affect:  Constricted  Thought Process:  Goal Directed  Orientation:  Full (Time, Place, and Person)  Thought Content:  Rumination  Suicidal Thoughts:  No  Homicidal Thoughts:  No  Memory:  Immediate;   Fair  Judgement:  Fair  Insight:  Shallow   Psychomotor Activity:  Decreased  Concentration:  Concentration: Fair  Recall:  Good  Fund of Knowledge:Good  Language: Good  Akathisia:  No  Handed:    AIMS (if indicated):  not done  Assets:  Desire for Improvement Housing  ADL's:  Intact  Cognition: WNL  Sleep:  Fair   Screenings: GAD-7    Flowsheet Row Initial P[facility] [place]  Total GAD-7 Score 4      PHQ2-9    Flowsheet Row Office Visit from 08/27/2021 in [facility] [place] Initial Prenatal from [facility]11/2020 [facility] Total Score 2 1  PHQ-9 Total Score 11 8      Flowsheet Row Video Visit from 01/08/2022 in [facility]  AT [facility] ED from 11/25/2021 in [facility] Psychiatric Visit from 10/01/2021 in [place] No Risk No Risk No Risk       Assessment and Plan: as follows  Prior documentation reviewed  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 Renewed meds Merian Capron, MD 11/1/202310:10 AM

## 2022-06-01 ENCOUNTER — Encounter: Payer: Self-pay | Admitting: Family

## 2022-08-25 ENCOUNTER — Telehealth (HOSPITAL_COMMUNITY): Payer: Medicaid Other | Admitting: Psychiatry

## 2022-08-25 ENCOUNTER — Encounter: Payer: Self-pay | Admitting: Family

## 2022-08-25 ENCOUNTER — Encounter (HOSPITAL_COMMUNITY): Payer: Self-pay

## 2022-08-27 ENCOUNTER — Encounter (HOSPITAL_COMMUNITY): Payer: Self-pay | Admitting: Psychiatry

## 2022-08-27 ENCOUNTER — Telehealth (INDEPENDENT_AMBULATORY_CARE_PROVIDER_SITE_OTHER): Payer: BC Managed Care – PPO | Admitting: Psychiatry

## 2022-08-27 DIAGNOSIS — F53 Postpartum depression: Secondary | ICD-10-CM

## 2022-08-27 DIAGNOSIS — Z639 Problem related to primary support group, unspecified: Secondary | ICD-10-CM | POA: Diagnosis not present

## 2022-08-27 DIAGNOSIS — F331 Major depressive disorder, recurrent, moderate: Secondary | ICD-10-CM

## 2022-08-27 MED ORDER — ESCITALOPRAM OXALATE 10 MG PO TABS: 10.0000 mg | ORAL_TABLET | Freq: Every day | ORAL | 1 refills | Status: DC

## 2022-08-27 MED ORDER — ESCITALOPRAM OXALATE 20 MG PO TABS: 20.0000 mg | ORAL_TABLET | Freq: Every day | ORAL | 1 refills | Status: DC

## 2022-08-27 NOTE — Progress Notes (Signed)
[place] Follow up visit   Patient Identification: Michelle Knight MRN:  [id number] Date of Evaluation:  08/27/2022 Referral Source: primary care Chief Complaint:   No chief complaint on file. Follow up depression Visit Diagnosis:    ICD-10-CM   1. MDD (major depressive disorder), recurrent episode, moderate (HCC)  F33.1     2. Postpartum depression  F53.0 escitalopram (LEXAPRO) 20 MG tablet    3. Relationship dysfunction  Z63.9      Virtual Visit via Video Note  I connected with Michelle Knight on 08/27/22 at  8:30 AM EST by a video enabled telemedicine application and verified that I am speaking with the correct person using two identifiers.  Location: Patient: home Provider: home office   I discussed the limitations of evaluation and management by telemedicine and the availability of in person appointments. The patient expressed understanding and agreed to proceed.      I discussed the assessment and treatment plan with the patient. The patient was provided an opportunity to ask questions and all were answered. The patient agreed with the plan and demonstrated an understanding of the instructions.   The patient was advised to call back or seek an in-person evaluation if the symptoms worsen or if the condition fails to improve as anticipated.  I provided 15 minutes of non-face-to-face time during this encounter.     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  She has felt her mom and siblings did not understand what she has gone through at that  time.  Therapy helps with stressors Aggravating factors relationship concerns with her adopted family.  Postpartum. financial  Modifying factors; cousin, baby,    Severity improved    Past Psychiatric History: depression, anxiety  Previous Psychotropic Medications: Yes   Substance Abuse History in the last 12 months:  Yes.    Consequences of Substance Abuse: Sporadic use of alcohol, idsucssed its effect on depression  Past Medical History:  Past Medical History:  Diagnosis Date   Anemia    IUFD at 20 weeks or more of gestation     Past Surgical History:  Procedure Laterality Date   WISDOM TOOTH EXTRACTION      Family Psychiatric History: denies, was adapted  Family History:  Family History  Adopted: Yes    Social History:   Social History   Socioeconomic History   Marital status: Single    Spouse name: Not on file   Number of children: 1   Years of education: 12   Highest education level: Not on file  Occupational History   Not on file  Tobacco Use   Smoking status: Never   Smokeless tobacco: Never  Vaping Use   Vaping Use: Never used  Substance and Sexual Activity   Alcohol use: Not Currently   Drug use: Not Currently    Types: Marijuana    Comment: last use oct 2021   Sexual activity: Yes    Birth control/protection: None  Other Topics Concern   Not on file  Social History Narrative   Right handed   Some caffeine   Two story home   Social Determinants of Health   Financial Resource Strain: Not on file  Food Insecurity: Not on file  Transportation Needs: Not on file  Physical Activity: Not on file  Stress: Not on file  Social Connections: Not on file      Allergies:   Allergies  Allergen Reactions   Other Itching, Swelling and Other (See Comments)    Certain exotic fruits...mango, dragonfruit, papaya,etc. Tongue swells.   Mixed Grasses Itching   Tramadol Hives    Metabolic Disorder Labs: Lab Results  Component Value Date    HGBA1C 5.3 11/11/2020   MPG 105 11/11/2020   No results found for: "PROLACTIN" No results found for: "CHOL", "TRIG", "HDL", "CHOLHDL", "VLDL", "LDLCALC" No results found for: "TSH"  Therapeutic Level Labs: No results found for: "LITHIUM" No results found for: "CBMZ" No results found for: "VALPROATE"  Current Medications: Current Outpatient Medications  Medication Sig Dispense Refill   escitalopram (LEXAPRO) 10 MG tablet Take 1 tablet (10 mg total) by mouth daily. This is in addition to '20mg'$ . Total dose is '30mg'$  30 tablet 1   escitalopram (LEXAPRO) 20 MG tablet Take 1 tablet (20 mg total) by mouth daily. This is in addition to '10mg'$ . Total dose is '30mg'$ . Delete prior refills of lexapro 30 tablet 1   Fe Bisgly-Vit C-Vit B12-FA (GENTLE IRON PO) Take by mouth.     levonorgestrel (LILETTA, 52 MG,) 20.1 MCG/DAY IUD 1 each by Intrauterine route once.     methylPREDNISolone (MEDROL DOSEPAK) 4 MG TBPK tablet Take as directed (Patient not taking: Reported on 03/26/2022) 1 each 0   rizatriptan (MAXALT) 10 MG tablet Take 1 tablet (10 mg total) by mouth as needed for migraine (May repeat in 2 hours.  maximum 2 tablets in 24 hours.). May repeat in 2 hours if needed 10 tablet 2   topiramate (TOPAMAX) 50 MG tablet Take 1 tablet (50 mg total) by mouth at bedtime. 30 tablet 5   No current facility-administered medications for this visit.     Psychiatric Specialty Exam: Review of Systems  Cardiovascular:  Negative for chest pain.  Neurological:  Negative for tremors.  Psychiatric/Behavioral:  Negative for agitation and dysphoric mood.     not currently breastfeeding.There is no height or weight on file to calculate BMI.  General Appearance: Casual  Eye Contact:  Fair  Speech:  Clear and Coherent  Volume:  Decreased  Mood:  fair  Affect:  Constricted  Thought Process:  Goal Directed  Orientation:  Full (Time, Place, and Person)  Thought Content:  Rumination  Suicidal Thoughts:  No  Homicidal  Thoughts:  No  Memory:  Immediate;   Fair  Judgement:  Fair  Insight:  Shallow  Psychomotor Activity:  Decreased  Concentration:  Concentration: Fair  Recall:  Good  Fund of Knowledge:Good  Language: Good  Akathisia:  No  Handed:    AIMS (if indicated):  not done  Assets:  Desire for Improvement Housing  ADL's:  Intact  Cognition: WNL  Sleep:  Fair   Screenings: GAD-7    Flowsheet Row Initial Prenatal from 11/11/2020 in [facility] [place] [facility] [place] Office Visit from 08/27/2021 in [place] [facility] Initial Prenatal from 11/11/2020 in [facility] [place] at [employer]  PHQ-2 Total Score 2 1  PHQ-9 Total Score 11 8      Flowsheet Row Video Visit from 05/05/2022 in [place] at [facility] Video Visit from 01/08/2022 in [place] at [facility] ED from 11/25/2021 in [facility] at [place] No Risk No Risk No Risk       Assessment and Plan: as follows  Prior documentation reviewed  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 Renewed meds Merian Capron, MD 2/23/20248:39 AM

## 2022-09-17 ENCOUNTER — Other Ambulatory Visit: Payer: Self-pay | Admitting: Neurology

## 2022-09-22 NOTE — Progress Notes (Deleted)
NEUROLOGY FOLLOW UP OFFICE NOTE  Michelle Knight [id number]  Assessment/Plan:   Chronic migraine without aura, without status migrainosus, not intractable   Migraine prevention:  increase topiramate to 50mg  at bedtime Migraine rescue:  rizatriptan 10mg .  Will have her try Nurtec to take at work as it less likely may cause drowsiness.  She will let me know if effective.  Otherwise, we can try Ubrelvy *** Limit use of pain relievers to no more than 2 days out of week to prevent risk of rebound or medication-overuse headache. Keep headache diary Follow up 6 months       Subjective:  Michelle Knight is a 22 year old female who follows up for migraines.  UPDATE: Increased topiramate last visit. Tried Nurtec/Ubrelvy at work, which *** Intensity:  severe Duration:  5-10 minutes with rizatriptan.   Frequency:  5-6 a month (4 are severe) Current NSAIDS/analgesics:  none Current triptans:  rizatriptan 10mg  (effective but makes her sleepy - cannot take at work) Current ergotamine:  none Current anti-emetic:  none Current muscle relaxants:  none Current Antihypertensive medications:  none Current Antidepressant medications:  Lexapro Current Anticonvulsant medications:  topiramate 50mg  QHS Current anti-CGRP:  none Current Vitamins/Herbal/Supplements:  none Current Antihistamines/Decongestants:  none Other therapy:  none Hormone/birth control:  levonorgestrel  Caffeine:  rarely Diet:  5 to 6 bottles water daily.  Does not skip meals.  Tries to eat healthy Exercise:  a little bit Depression:  yes; Anxiety:  no Other pain:  no Sleep hygiene:  good   HISTORY:  Onset:  22 years old Location:  mostly frontal and left greater than left temple Quality:  pressure/throbbing Intensity:  8/10.   Aura:  absent Prodrome:  absent Associated symptoms:  Photophobia, sees dots and blurriness with quick head movements.  Sometimes difficulty taking a breath if severe.  Sometimes nausea.   She denies associated vomiting, phonophobia, unilateral numbness or weakness. Duration:  2-5 hours Frequency:  daily Frequency of abortive medication: none Triggers:  unknown Relieving factors:  sit in dark place, shower Activity:  aggravates     Past NSAIDS/analgesics:  Ibuprofen, Excedrin, Tylenol Past abortive triptans:  sumatriptan tab Past abortive ergotamine:  none Past muscle relaxants:  none Past anti-emetic:  none Past antihypertensive medications:  none Past antidepressant medications:  none Past anticonvulsant medications:  none Past anti-CGRP:  none Past vitamins/Herbal/Supplements:  none Past antihistamines/decongestants:  none Other past therapies:  none    Family history of headache:  unknown.  Adopted.    PAST MEDICAL HISTORY: Past Medical History:  Diagnosis Date   Anemia    IUFD at 33 weeks or more of gestation     MEDICATIONS: Current Outpatient Medications on File Prior to Visit  Medication Sig Dispense Refill   escitalopram (LEXAPRO) 10 MG tablet Take 1 tablet (10 mg total) by mouth daily. This is in addition to 20mg . Total dose is 30mg  30 tablet 1   escitalopram (LEXAPRO) 20 MG tablet Take 1 tablet (20 mg total) by mouth daily. This is in addition to 10mg . Total dose is 30mg . Delete prior refills of lexapro 30 tablet 1   Fe Bisgly-Vit C-Vit B12-FA (GENTLE IRON PO) Take by mouth.     levonorgestrel (LILETTA, 52 MG,) 20.1 MCG/DAY IUD 1 each by Intrauterine route once.     methylPREDNISolone (MEDROL DOSEPAK) 4 MG TBPK tablet Take as directed (Patient not taking: Reported on 03/26/2022) 1 each 0   rizatriptan (MAXALT) 10 MG tablet Take 1 tablet (10 mg total)  by mouth as needed for migraine (May repeat in 2 hours.  maximum 2 tablets in 24 hours.). May repeat in 2 hours if needed 10 tablet 2   topiramate (TOPAMAX) 50 MG tablet TAKE 1 TABLET BY MOUTH AT BEDTIME 30 tablet 0   No current facility-administered medications on file prior to visit.     ALLERGIES: Allergies  Allergen Reactions   Other Itching, Swelling and Other (See Comments)    Certain exotic fruits...mango, dragonfruit, papaya,etc. Tongue swells.   Mixed Grasses Itching   Tramadol Hives    FAMILY HISTORY: Family History  Adopted: Yes      Objective:  *** General: No acute distress.  Patient appears well-groomed.   Head:  Normocephalic/atraumatic Eyes:  Fundi examined but not visualized Neck: supple, no paraspinal tenderness, full range of motion Heart:  Regular rate and rhythm Neurological Exam: ***   Metta Clines, DO  CC: Danice Goltz, MD

## 2022-09-24 ENCOUNTER — Ambulatory Visit: Payer: Medicaid Other | Admitting: Neurology

## 2022-09-27 ENCOUNTER — Encounter: Payer: Self-pay | Admitting: Family

## 2022-10-12 ENCOUNTER — Encounter: Payer: Self-pay | Admitting: Family

## 2022-10-19 ENCOUNTER — Encounter: Payer: Self-pay | Admitting: Obstetrics and Gynecology

## 2022-10-19 ENCOUNTER — Other Ambulatory Visit (HOSPITAL_COMMUNITY)
Admission: RE | Admit: 2022-10-19 | Discharge: 2022-10-19 | Disposition: A | Discharge status: Home / Self Care | Payer: BC Managed Care – PPO | Source: Ambulatory Visit | Attending: Obstetrics and Gynecology | Admitting: Obstetrics and Gynecology

## 2022-10-19 ENCOUNTER — Ambulatory Visit: Payer: BC Managed Care – PPO | Admitting: Obstetrics and Gynecology

## 2022-10-19 VITALS — BP 119/79 | HR 60 | Ht <= 58 in | Wt 205.0 lb

## 2022-10-19 DIAGNOSIS — Z01419 Encounter for gynecological examination (general) (routine) without abnormal findings: Secondary | ICD-10-CM | POA: Diagnosis not present

## 2022-10-19 DIAGNOSIS — Z113 Encounter for screening for infections with a predominantly sexual mode of transmission: Secondary | ICD-10-CM | POA: Insufficient documentation

## 2022-10-19 NOTE — Progress Notes (Signed)
GYNECOLOGY ANNUAL PREVENTATIVE CARE ENCOUNTER NOTE  History:     Michelle Knight is a 22 y.o. [phone] female here for a routine annual gynecologic exam.  Current complaints: None.   Denies abnormal vaginal bleeding, discharge, pelvic pain, problems with intercourse or other gynecologic concerns.  Considering having another baby in the future..    Gynecologic History Patient's last menstrual period was 09/01/2022. Contraception: IUD Last Pap: 2022. Result was normal with negative HPV   Obstetric History OB History  Gravida Para Term Preterm AB Living  SAB IAB Ectopic Multiple Live Births  2     0 1    # Outcome Date GA Lbr Len/2nd Weight Sex Delivery Anes PTL Lv  4 Term 06/09/21 [redacted]w[redacted]d 08:13 / 00:55 6 lb 10.2 oz (3.01 kg) M Vag-Spont EPI  LIV  3 SAB           2 SAB           1 Term         FD    Past Medical History:  Diagnosis Date   Anemia    IUFD at 38 weeks or more of gestation     Past Surgical History:  Procedure Laterality Date   WISDOM TOOTH EXTRACTION      Current Outpatient Medications on File Prior to Visit  Medication Sig Dispense Refill   Cholecalciferol (VITAMIN D-1000 MAX ST) 25 MCG (1000 UT) tablet Take by mouth.     escitalopram (LEXAPRO) 10 MG tablet Take 1 tablet (10 mg total) by mouth daily. This is in addition to . Total dose is  30 tablet 1   escitalopram (LEXAPRO) 20 MG tablet Take 1 tablet (20 mg total) by mouth daily. This is in addition to . Total dose is . Delete prior refills of lexapro 30 tablet 1   Fe Bisgly-Vit C-Vit B12-FA (GENTLE IRON PO) Take by mouth.     levonorgestrel (LILETTA, 52 MG,) 20.1 MCG/DAY IUD 1 each by Intrauterine route once.     No current facility-administered medications on file prior to visit.    Allergies  Allergen Reactions   Other Itching, Other (See Comments), Swelling and Nausea And Vomiting    Certain exotic fruits...mango, dragonfruit, papaya,etc. Tongue swells.  Certain  exotic fruits...mango, dragonfruit, papaya,etc. Tongue swells.  Other reaction(s): Other (See Comments), Unknown  Certain fruits, unsure which ones  Grasses  Dust mites  Certain exotic fruits...mango, dragonfruit, papaya,etc. Tongue swells.  Certain fruits, unsure which ones  Grasses  Dust mites   Tramadol Hives and Rash    hives   Mixed Grasses Itching    Social History:  reports that she has never smoked. She has never used smokeless tobacco. She reports that she does not currently use alcohol. She reports that she does not currently use drugs after having used the following drugs: Marijuana.  Family History  Adopted: Yes    The following portions of the patient's history were reviewed and updated as appropriate: allergies, current medications, past family history, past medical history, past social history, past surgical history and problem list.  Review of Systems Pertinent items noted in HPI and remainder of comprehensive ROS otherwise negative.  Physical Exam:  BP 119/79   Pulse 60   Ht  (1.448 m)   Wt 205 lb (93 kg)   LMP 09/01/2022   BMI 44.36 kg/m  CONSTITUTIONAL: Well-developed, well-nourished female in no acute distress.  HENT:  Normocephalic, atraumatic,  External right and left ear normal.  EYES: Conjunctivae and EOM are normal. Pupils are equal, round, and reactive to light. No scleral icterus.  NECK: Normal range of motion, supple, no masses.  Normal thyroid.  SKIN: Skin is warm and dry. No rash noted. Not diaphoretic. No erythema. No pallor. MUSCULOSKELETAL: Normal range of motion. No tenderness.  No cyanosis, clubbing, or edema. NEUROLOGIC: Alert and oriented to person, place, and time. Normal reflexes, muscle tone coordination.  PSYCHIATRIC: Normal mood and affect. Normal behavior. Normal judgment and thought content. CARDIOVASCULAR: Normal heart rate noted, regular rhythm RESPIRATORY: Clear to auscultation bilaterally. Effort and breath sounds normal,  no problems with respiration noted. BREASTS: Symmetric in size. No masses, tenderness, skin changes, nipple drainage, or lymphadenopathy bilaterally. Performed in the presence of a chaperone. ABDOMEN: Soft, no distention noted.  No tenderness, rebound or guarding.  PELVIC: Normal appearing external genitalia and urethral meatus, wet prep complete. Assessment and Plan:   1. Screening examination for STI  - Cervicovaginal ancillary only( [place])  2. Women's annual routine gynecological examination   Shayanna Thatch, Harolyn Rutherford, NP [facility] [employer], [facility]

## 2022-10-20 LAB — CERVICOVAGINAL ANCILLARY ONLY
Bacterial Vaginitis (gardnerella): POSITIVE — AB
Candida Glabrata: NEGATIVE
Candida Vaginitis: NEGATIVE
Chlamydia: NEGATIVE
Comment: NEGATIVE
Comment: NEGATIVE
Comment: NEGATIVE
Comment: NEGATIVE
Comment: NEGATIVE
Comment: NORMAL
Neisseria Gonorrhea: NEGATIVE
Trichomonas: NEGATIVE

## 2022-10-21 ENCOUNTER — Ambulatory Visit
Admission: EM | Admit: 2022-10-21 | Discharge: 2022-10-21 | Disposition: A | Discharge status: Home / Self Care | Payer: BC Managed Care – PPO | Attending: Family Medicine | Admitting: Family Medicine

## 2022-10-21 ENCOUNTER — Encounter: Payer: Self-pay | Admitting: Family

## 2022-10-21 DIAGNOSIS — J029 Acute pharyngitis, unspecified: Secondary | ICD-10-CM | POA: Diagnosis not present

## 2022-10-21 LAB — POCT RAPID STREP A (OFFICE): Rapid Strep A Screen: NEGATIVE

## 2022-10-21 NOTE — ED Triage Notes (Signed)
Pt presents with c/o HA, fever, sore throat and otalgia that began yesterday

## 2022-10-21 NOTE — Discharge Instructions (Signed)
Strep test is negative This is a viral infection Drink lots of fluids Take Tylenol or ibuprofen for pain and fever

## 2022-10-21 NOTE — ED Provider Notes (Signed)
[facility]    CSN: [id number] Arrival date & time: 10/21/22  1134      History   Chief Complaint Chief Complaint  Patient presents with   Fever   Sore Throat    HPI Michelle Knight is a 22 y.o. female.   HPI  Here for sore throat, headache and low grade fever since yesterday No one at home is sick\ has  young children in day care No runny  nose or cough No body aches  Past Medical History:  Diagnosis Date   Anemia    IUFD at 38 weeks or more of gestation     Patient Active Problem List   Diagnosis Date Noted   Screening examination for STI 10/19/2022   Postpartum depression 06/16/2021   Vaginal delivery 06/09/2021   IDA (iron deficiency anemia) 05/05/2021   History of IUFD 11/11/2020   History of gestational diabetes 11/11/2020   History of anxiety 11/11/2020   Depression 11/11/2020    Past Surgical History:  Procedure Laterality Date   WISDOM TOOTH EXTRACTION      OB History     Gravida  3   Para  2   Term  2   Preterm      AB  1   Living  1      SAB  1   IAB      Ectopic      Multiple  0   Live Births  1            Home Medications    Prior to Admission medications   Medication Sig Start Date End Date Taking? Authorizing Provider  Cholecalciferol (VITAMIN D-1000 MAX ST) 25 MCG (1000 UT) tablet Take by mouth.    [provider]  escitalopram (LEXAPRO) 10 MG tablet Take 1 tablet (10 mg total) by mouth daily. This is in addition to . Total dose is  08/27/22   Thresa Ross, MD  escitalopram (LEXAPRO) 20 MG tablet Take 1 tablet (20 mg total) by mouth daily. This is in addition to . Total dose is . Delete prior refills of lexapro 08/27/22   Thresa Ross, MD  Fe Bisgly-Vit C-Vit B12-FA (GENTLE IRON PO) Take by mouth.    [provider]  levonorgestrel (LILETTA, 52 MG,) 20.1 MCG/DAY IUD 1 each by Intrauterine route once.    [provider]    Family History Family  History  Adopted: Yes    Social History Social History   Tobacco Use   Smoking status: Never   Smokeless tobacco: Never  Vaping Use   Vaping Use: Never used  Substance Use Topics   Alcohol use: Not Currently   Drug use: Not Currently    Types: Marijuana    Comment: last use oct 2021     Allergies   Other, Tramadol, and Mixed grasses   Review of Systems Review of Systems See HPI  Physical Exam Triage Vital Signs ED Triage Vitals [10/21/22 1139]  Enc Vitals Group     BP 137/82     Pulse Rate 87     Resp 14     Temp 98.3 F (36.8 C)     Temp Source Oral     SpO2 98 %     Weight      Height      Head Circumference      Peak Flow      Pain Score 4     Pain Loc  Pain Edu?      Excl. in GC?    No data found.  Updated Vital Signs BP 137/82 (BP Location: Right Arm)   Pulse 87   Temp 98.3 F (36.8 C) (Oral)   Resp 14   LMP 09/01/2022   SpO2 98%      Physical Exam Constitutional:      General: She is not in acute distress.    Appearance: She is well-developed and normal weight.  HENT:     Head: Normocephalic and atraumatic.     Right Ear: Tympanic membrane and ear canal normal.     Left Ear: Tympanic membrane and ear canal normal.     Nose: No congestion.     Mouth/Throat:     Mouth: Mucous membranes are moist.     Pharynx: Posterior oropharyngeal erythema present.     Tonsils: No tonsillar exudate. 0 on the right. 0 on the left.  Eyes:     Conjunctiva/sclera: Conjunctivae normal.     Pupils: Pupils are equal, round, and reactive to light.  Cardiovascular:     Rate and Rhythm: Normal rate and regular rhythm.     Heart sounds: Normal heart sounds.  Pulmonary:     Effort: Pulmonary effort is normal. No respiratory distress.     Breath sounds: Normal breath sounds.  Abdominal:     General: There is no distension.     Palpations: Abdomen is soft.  Musculoskeletal:        General: Normal range of motion.     Cervical back: Normal range of  motion.  Lymphadenopathy:     Cervical: No cervical adenopathy.  Skin:    General: Skin is warm and dry.  Neurological:     Mental Status: She is alert.      UC Treatments / Results  Labs (all labs ordered are listed, but only abnormal results are displayed) Labs Reviewed  CULTURE, GROUP A STREP [facility])  POCT RAPID STREP A (OFFICE)    EKG   Radiology No results found.  Procedures Procedures (including critical care time)  Medications Ordered in UC Medications - No data to display  Initial Impression / Assessment and Plan / UC Course  I have reviewed the triage vital signs and the nursing notes.  Pertinent labs & imaging results that were available during my care of the patient were reviewed by me and considered in my medical decision making (see chart for details).     Final Clinical Impressions(s) / UC Diagnoses   Final diagnoses:  Viral pharyngitis     Discharge Instructions      Strep test is negative This is a viral infection Drink lots of fluids Take Tylenol or ibuprofen for pain and fever   ED Prescriptions   None    PDMP not reviewed this encounter.   Eustace Moore, MD 10/21/22 [phone]

## 2022-10-22 LAB — CULTURE, GROUP A STREP (THRC)

## 2022-10-23 LAB — CULTURE, GROUP A STREP (THRC)

## 2022-10-24 LAB — CULTURE, GROUP A STREP (THRC)

## 2022-10-25 ENCOUNTER — Telehealth: Payer: Self-pay | Admitting: Obstetrics and Gynecology

## 2022-10-25 NOTE — Telephone Encounter (Signed)
Attempted to call the patient and discuss + BV result. No answer.   Duane Lope, NP 10/25/2022 10:43 AM

## 2022-11-15 ENCOUNTER — Encounter: Payer: Self-pay | Admitting: Family

## 2022-12-15 ENCOUNTER — Encounter: Payer: Self-pay | Admitting: Family

## 2022-12-15 ENCOUNTER — Telehealth (INDEPENDENT_AMBULATORY_CARE_PROVIDER_SITE_OTHER): Payer: BC Managed Care – PPO | Admitting: Psychiatry

## 2022-12-15 ENCOUNTER — Encounter (HOSPITAL_COMMUNITY): Payer: Self-pay | Admitting: Psychiatry

## 2022-12-15 DIAGNOSIS — Z639 Problem related to primary support group, unspecified: Secondary | ICD-10-CM

## 2022-12-15 DIAGNOSIS — F53 Postpartum depression: Secondary | ICD-10-CM

## 2022-12-15 DIAGNOSIS — F331 Major depressive disorder, recurrent, moderate: Secondary | ICD-10-CM

## 2022-12-15 MED ORDER — ESCITALOPRAM OXALATE 10 MG PO TABS: 10.0000 mg | ORAL_TABLET | Freq: Every day | ORAL | 1 refills | Status: DC

## 2022-12-15 MED ORDER — ESCITALOPRAM OXALATE 20 MG PO TABS
20.0000 mg | ORAL_TABLET | Freq: Every day | ORAL | 1 refills | Status: DC
Start: 2022-12-15 — End: 2023-04-20

## 2022-12-15 NOTE — Progress Notes (Signed)
[facility] Follow up visit   Patient Identification: Michelle Knight MRN:  [id number] Date of Evaluation:  12/15/2022 Referral Source: primary care Chief Complaint:   No chief complaint on file. Follow up depression Visit Diagnosis:    ICD-10-CM   1. MDD (major depressive disorder), recurrent episode, moderate (HCC)  F33.1     2. Relationship dysfunction  Z63.9     3. Postpartum depression  F53.0     Virtual Visit via Video Note  I connected with Michelle Knight on 12/15/22 at 11:30 AM EDT by a video enabled telemedicine application and verified that I am speaking with the correct person using two identifiers.  Location: Patient: parked car Provider: home office   I discussed the limitations of evaluation and management by telemedicine and the availability of in person appointments. The patient expressed understanding and agreed to proceed.      I discussed the assessment and treatment plan with the patient. The patient was provided an opportunity to ask questions and all were answered. The patient agreed with the plan and demonstrated an understanding of the instructions.   The patient was advised to call back or seek an in-person evaluation if the symptoms worsen or if the condition fails to improve as anticipated.  I provided 15 minutes of non-face-to-face time during this encounter.          History of Present Illness:  Patient is a 22 years old currently single [place] descent female who grew up with her adopted parents since age 52 months  she works Clinical biochemist, she currently lives with her boyfriend and his dad  Post partum 18 months  Doing fair, working 42 hours, gets less time with kid  No headaches or side effects   Less triggers from past   Therapy helps with stressors Aggravating factors relationship concerns with her adopted family.  Postpartum. Financial, had still birth in past Modifying factors; cousin, baby   Severity   manageable    Past Psychiatric History: depression, anxiety  Previous Psychotropic Medications: Yes   Substance Abuse History in the last 12 months:  Yes.    Consequences of Substance Abuse: Sporadic use of alcohol, idsucssed its effect on depression  Past Medical History:  Past Medical History:  Diagnosis Date   Anemia    IUFD at 38 weeks or more of gestation     Past Surgical History:  Procedure Laterality Date   WISDOM TOOTH EXTRACTION      Family Psychiatric History: denies, was adapted  Family History:  Family History  Adopted: Yes    Social History:   Social History   Socioeconomic History   Marital status: Single    Spouse name: Not on file   Number of children: 1   Years of education: 12   Highest education level: Not on file  Occupational History   Not on file  Tobacco Use   Smoking status: Never   Smokeless tobacco: Never  Vaping Use   Vaping Use: Never used  Substance and Sexual Activity   Alcohol use: Not Currently   Drug use: Not Currently    Types: Marijuana    Comment: last use oct 2021   Sexual activity: Yes    Birth control/protection: I.U.D.  Other Topics Concern   Not on file  Social History Narrative   Right handed   Some caffeine   Two story home   Social Determinants of Health   Financial Resource Strain: Not on file  Food Insecurity: Not on file  Transportation Needs: Not on file  Physical Activity: Not on file  Stress: Not on file  Social Connections: Not on file      Allergies:   Allergies  Allergen Reactions   Other Itching, Other (See Comments), Swelling and Nausea And Vomiting    Certain exotic fruits...mango, dragonfruit, papaya,etc. Tongue swells.  Certain exotic fruits...mango, dragonfruit, papaya,etc. Tongue swells.  Other reaction(s): Other (See Comments), Unknown  Certain fruits, unsure which ones  Grasses  Dust mites  Certain exotic fruits...mango, dragonfruit, papaya,etc. Tongue swells.  Certain  fruits, unsure which ones  Grasses  Dust mites   Tramadol Hives and Rash    hives   Mixed Grasses Itching    Metabolic Disorder Labs: Lab Results  Component Value Date   HGBA1C 5.3 11/11/2020   MPG 105 11/11/2020   No results found for: "PROLACTIN" No results found for: "CHOL", "TRIG", "HDL", "CHOLHDL", "VLDL", "LDLCALC" No results found for: "TSH"  Therapeutic Level Labs: No results found for: "LITHIUM" No results found for: "CBMZ" No results found for: "VALPROATE"  Current Medications: Current Outpatient Medications  Medication Sig Dispense Refill   Cholecalciferol (VITAMIN D-1000 MAX ST) 25 MCG (1000 UT) tablet Take by mouth.     escitalopram (LEXAPRO) 10 MG tablet Take 1 tablet (10 mg total) by mouth daily. This is in addition to 20mg . Total dose is 30mg  30 tablet 1   escitalopram (LEXAPRO) 20 MG tablet Take 1 tablet (20 mg total) by mouth daily. This is in addition to 10mg . Total dose is 30mg . Delete prior refills of lexapro 30 tablet 1   Fe Bisgly-Vit C-Vit B12-FA (GENTLE IRON PO) Take by mouth.     levonorgestrel (LILETTA, 52 MG,) 20.1 MCG/DAY IUD 1 each by Intrauterine route once.     No current facility-administered medications for this visit.     Psychiatric Specialty Exam: Review of Systems  Cardiovascular:  Negative for chest pain.  Neurological:  Negative for tremors.  Psychiatric/Behavioral:  Negative for agitation and dysphoric mood.     not currently breastfeeding.There is no height or weight on file to calculate BMI.  General Appearance: Casual  Eye Contact:  Fair  Speech:  Clear and Coherent  Volume:  Decreased  Mood:  fair  Affect:  Constricted  Thought Process:  Goal Directed  Orientation:  Full (Time, Place, and Person)  Thought Content:  Rumination  Suicidal Thoughts:  No  Homicidal Thoughts:  No  Memory:  Immediate;   Fair  Judgement:  Fair  Insight:  Shallow  Psychomotor Activity:  Decreased  Concentration:  Concentration: Fair   Recall:  Good  Fund of Knowledge:Good  Language: Good  Akathisia:  No  Handed:    AIMS (if indicated):  not done  Assets:  Desire for Improvement Housing  ADL's:  Intact  Cognition: WNL  Sleep:  Fair   Screenings: GAD-7    Flowsheet Row Initial Prenatal from 11/11/2020 in [facility] for [facility] [employer]  Total GAD-7 Score 4      PHQ2-9    Flowsheet Row Office Visit from 08/27/2021 in [facility] Initial Prenatal from 11/11/2020 in [facility]  PHQ-2 Total Score 2 1  PHQ-9 Total Score 11 8      Flowsheet Row ED from 10/21/2022 in [facility] Video Visit from 05/05/2022 in [facility] Video Visit from 01/08/2022 in [place]  [facility]  C-SSRS RISK CATEGORY No Risk No Risk No Risk       Assessment and Plan: as follows  Prior documentation reviewed  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 Renewed meds if due, work on life work balance  Thresa Ross, MD 6/12/202411:40 AM

## 2022-12-17 ENCOUNTER — Telehealth (HOSPITAL_COMMUNITY): Payer: Medicaid Other | Admitting: Psychiatry

## 2023-01-07 ENCOUNTER — Encounter: Payer: Self-pay | Admitting: Family

## 2023-02-07 ENCOUNTER — Encounter: Payer: Self-pay | Admitting: Family

## 2023-02-07 ENCOUNTER — Encounter: Payer: Self-pay | Admitting: Emergency Medicine

## 2023-02-07 ENCOUNTER — Ambulatory Visit (INDEPENDENT_AMBULATORY_CARE_PROVIDER_SITE_OTHER): Payer: BC Managed Care – PPO

## 2023-02-07 ENCOUNTER — Ambulatory Visit
Admission: EM | Admit: 2023-02-07 | Discharge: 2023-02-07 | Disposition: A | Discharge status: Home / Self Care | Payer: BC Managed Care – PPO | Attending: Family Medicine | Admitting: Family Medicine

## 2023-02-07 DIAGNOSIS — S99912A Unspecified injury of left ankle, initial encounter: Secondary | ICD-10-CM | POA: Diagnosis not present

## 2023-02-07 DIAGNOSIS — M25562 Pain in left knee: Secondary | ICD-10-CM

## 2023-02-07 DIAGNOSIS — M25572 Pain in left ankle and joints of left foot: Secondary | ICD-10-CM | POA: Diagnosis not present

## 2023-02-07 DIAGNOSIS — S8992XA Unspecified injury of left lower leg, initial encounter: Secondary | ICD-10-CM | POA: Diagnosis not present

## 2023-02-07 MED ORDER — IBUPROFEN 800 MG PO TABS: 800.0000 mg | ORAL_TABLET | Freq: Every day | ORAL | 0 refills | Status: AC | PRN

## 2023-02-07 NOTE — ED Provider Notes (Signed)
[facility]    CSN: [id number] Arrival date & time: 02/07/23  9381      History   Chief Complaint Chief Complaint  Patient presents with   Ankle Pain    HPI Michelle Knight is a 22 y.o. Knight.   HPI Michelle 22 year old Knight presents with left ankle pain and left knee pain secondary to fall yesterday at her home. PMH significant for obesity and postpartum depression.  Past Medical History:  Diagnosis Date   Anemia    IUFD at 38 weeks or more of gestation     Patient Active Problem List   Diagnosis Date Noted   Screening examination for STI 10/19/2022   Postpartum depression 06/16/2021   Vaginal delivery 06/09/2021   IDA (iron deficiency anemia) 05/05/2021   History of IUFD 11/11/2020   History of gestational diabetes 11/11/2020   History of anxiety 11/11/2020   Depression 11/11/2020    Past Surgical History:  Procedure Laterality Date   WISDOM TOOTH EXTRACTION      OB History     Gravida  3   Para  2   Term  2   Preterm      AB  1   Living  1      SAB  1   IAB      Ectopic      Multiple  0   Live Births  1            Home Medications    Prior to Admission medications   Medication Sig Start Date End Date Taking? Authorizing Provider  Cholecalciferol (VITAMIN D-1000 MAX ST) 25 MCG (1000 UT) tablet Take by mouth.   Yes [provider]  escitalopram (LEXAPRO) 10 MG tablet Take 1 tablet (10 mg total) by mouth daily. This is in addition to 20mg . Total dose is 30mg  12/15/22  Yes Thresa Ross, MD  escitalopram (LEXAPRO) 20 MG tablet Take 1 tablet (20 mg total) by mouth daily. This is in addition to 10mg . Total dose is 30mg . Delete prior refills of lexapro 12/15/22  Yes Thresa Ross, MD  Fe Bisgly-Vit C-Vit B12-FA (GENTLE IRON PO) Take by mouth.   Yes [provider]  ibuprofen (ADVIL) 800 MG tablet Take 1 tablet (800 mg total) by mouth daily as needed. 02/07/23  Yes Trevor Iha, FNP  levonorgestrel  (LILETTA, 52 MG,) 20.1 MCG/DAY IUD 1 each by Intrauterine route once.   Yes [provider]    Family History Family History  Adopted: Yes    Social History Social History   Tobacco Use   Smoking status: Never   Smokeless tobacco: Never  Vaping Use   Vaping status: Never Used  Substance Use Topics   Alcohol use: Not Currently   Drug use: Not Currently    Types: Marijuana    Comment: last use oct 2021     Allergies   Other, Tramadol, and Mixed grasses   Review of Systems Review of Systems  Musculoskeletal:        Left knee/left ankle pain secondary to fall yesterday at her home     Physical Exam Triage Vital Signs ED Triage Vitals  Encounter Vitals Group     BP 02/07/23 1015 118/77     Systolic BP Percentile --      Diastolic BP Percentile --      Pulse Rate 02/07/23 1015 65     Resp 02/07/23 1015 16     Temp 02/07/23 1015 98.5 F (36.9 C)  Temp Source 02/07/23 1015 Oral     SpO2 02/07/23 1015 93 %     Weight 02/07/23 1016 205 lb (93 kg)     Height 02/07/23 1016 4\' 9"  (1.448 m)     Head Circumference --      Peak Flow --      Pain Score 02/07/23 1016 6     Pain Loc --      Pain Education --      Exclude from Growth Chart --    No data found.  Updated Vital Signs BP 118/77 (BP Location: Right Arm)   Pulse 65   Temp 98.5 F (36.9 C) (Oral)   Resp 16   Ht 4\' 9"  (1.448 m)   Wt 205 lb (93 kg)   SpO2 93%   Breastfeeding No   BMI 44.36 kg/m      Physical Exam Vitals and nursing note reviewed.  Constitutional:      Appearance: Normal appearance. She is obese.  HENT:     Head: Normocephalic and atraumatic.     Mouth/Throat:     Mouth: Mucous membranes are moist.     Pharynx: Oropharynx is clear.  Eyes:     Extraocular Movements: Extraocular movements intact.     Conjunctiva/sclera: Conjunctivae normal.     Pupils: Pupils are equal, round, and reactive to light.  Cardiovascular:     Rate and Rhythm: Normal rate and regular  rhythm.     Pulses: Normal pulses.     Heart sounds: Normal heart sounds.  Pulmonary:     Effort: Pulmonary effort is normal.     Breath sounds: Normal breath sounds. No wheezing, rhonchi or rales.  Musculoskeletal:        General: Normal range of motion.     Cervical back: Normal range of motion and neck supple.  Skin:    General: Skin is warm and dry.  Neurological:     General: No focal deficit present.     Mental Status: She is alert and oriented to person, place, and time. Mental status is at baseline.  Psychiatric:        Mood and Affect: Mood normal.        Behavior: Behavior normal.        Thought Content: Thought content normal.      UC Treatments / Results  Labs (all labs ordered are listed, but only abnormal results are displayed) Labs Reviewed - No data to display  EKG   Radiology DG Ankle Complete Left  Result Date: 02/07/2023 CLINICAL DATA:  Left ankle pain after fall yesterday. EXAM: LEFT ANKLE COMPLETE - 3+ VIEW COMPARISON:  None Available. FINDINGS: There is no evidence of fracture, dislocation, or joint effusion. There is no evidence of arthropathy or other focal bone abnormality. Soft tissues are unremarkable. IMPRESSION: Negative. Electronically Signed   By: Lupita Raider M.D.   On: 02/07/2023 11:02   DG Knee Complete 4 Views Left  Result Date: 02/07/2023 CLINICAL DATA:  Left knee pain after fall yesterday. EXAM: LEFT KNEE - COMPLETE 4+ VIEW COMPARISON:  None Available. FINDINGS: No evidence of fracture, dislocation, or joint effusion. No evidence of arthropathy or other focal bone abnormality. Soft tissues are unremarkable. IMPRESSION: Negative. Electronically Signed   By: Lupita Raider M.D.   On: 02/07/2023 11:01    Procedures Procedures (including critical care time)  Medications Ordered in UC Medications - No data to display  Initial Impression / Assessment and Plan / UC  Course  I have reviewed the triage vital signs and the nursing  notes.  Pertinent labs & imaging results that were available during my care of the patient were reviewed by me and considered in my medical decision making (see chart for details).     MDM: 1.  Left ankle injury, initial encounter-left ankle x-ray revealed above, left ankle wrapped with Ace wrap prior to discharge today, Rx Ibuprofen 800 mg: Take 1 tab daily or as needed for left ankle pain. 2.  Injury of left knee, initial encounter-left knee x-ray revealed above, left knee wrapped with Ace wrap prior to discharge today, Rx Ibuprofen 800 mg: Take 1 tab daily or as needed for left knee pain. Advised patient of left ankle/left knee x-ray results with hardcopy provided.  Advised patient may RICE affected areas of left ankle and left knee for 30 minutes 3 times daily for the next 3 days. Advised may take prescribed Ibuprofen 800 mg daily, as needed.  Advised if symptoms worsen and/or unresolved please follow-up with PCP, [place] [facility], or here for further evaluation.  Patient discharged home, hemodynamically stable Final Clinical Impressions(s) / UC Diagnoses   Final diagnoses:  Left ankle injury, initial encounter  Injury of left knee, initial encounter     Discharge Instructions      Advised patient of left ankle/left knee x-ray results with hardcopy provided.  Advised patient may RICE affected areas of left ankle and left knee for 30 minutes 3 times daily for the next 3 days. Advised may take prescribed Ibuprofen 800 mg daily, as needed.  Advised if symptoms worsen and/or unresolved please follow-up with PCP,  orthopedics, or here for further evaluation.     ED Prescriptions     Medication Sig Dispense Auth. Provider   ibuprofen (ADVIL) 800 MG tablet Take 1 tablet (800 mg total) by mouth daily as needed. 21 tablet Trevor Iha, FNP      PDMP not reviewed this encounter.   Trevor Iha, FNP 02/07/23 1221

## 2023-02-07 NOTE — Discharge Instructions (Addendum)
Advised patient of left ankle/left knee x-ray results with hardcopy provided.  Advised patient may RICE affected areas of left ankle and left knee for 30 minutes 3 times daily for the next 3 days. Advised may take prescribed Ibuprofen 800 mg daily, as needed.  Advised if symptoms worsen and/or unresolved please follow-up with PCP, [place] [facility], or here for further evaluation.

## 2023-02-07 NOTE — ED Triage Notes (Signed)
Patient c/o left ankle pain from fall yesterday.  Patient stepped down on a step and it felt like her "leg did not respond".  Patient was holding her son so instead of falling forward, she fell backwards.  Also c/o left knee pain.  Patient has taken Tylenol.

## 2023-02-09 ENCOUNTER — Encounter: Payer: Self-pay | Admitting: Family

## 2023-02-16 ENCOUNTER — Encounter: Payer: Self-pay | Admitting: Sports Medicine

## 2023-02-16 ENCOUNTER — Ambulatory Visit (INDEPENDENT_AMBULATORY_CARE_PROVIDER_SITE_OTHER): Payer: BC Managed Care – PPO | Admitting: Sports Medicine

## 2023-02-16 VITALS — BP 112/80 | Ht <= 58 in | Wt 205.0 lb

## 2023-02-16 DIAGNOSIS — S93402D Sprain of unspecified ligament of left ankle, subsequent encounter: Secondary | ICD-10-CM

## 2023-02-16 NOTE — Progress Notes (Signed)
   Subjective:    Patient ID: Michelle Knight, female    DOB: 02-Sep-2000, 22 y.o.   MRN: [id number]  HPI chief complaint: Left ankle pain  Patient is a very pleasant 22 year old female who comes in after having injured her left ankle 1 week ago.  She is unsure of the exact mechanism of injury.  She was seen at a local urgent care and x-rays were negative for fracture.  She was placed into a compression wrap and follows up with [place] today.  Overall, her pain is improving.  She did develop swelling after her injury.  She has had a history of ankle sprains in both ankles in the past.  Past medical history reviewed Medications reviewed Allergies reviewed    Review of Systems As above    Objective:   Physical Exam  Well-developed, well-nourished.  No acute distress  Left ankle: Full range of motion.  No effusion.  Mild soft tissue swelling laterally.  There is some slight tenderness to palpation at the ATF.  No tenderness to palpation along the medial ankle.  2+ talar tilt, negative anterior drawer.  Neurovascularly intact distally.  Left ankle x-rays are as above      Assessment & Plan:   Left ankle sprain  Patient is fitted for a med spec brace with instructions to wear this when ambulating on uneven ground.  She will start a home exercise program consisting of strengthening and proprioception exercises.  She may increase her activity as tolerated and will follow-up for ongoing or recalcitrant issues.  This note was dictated using Dragon naturally speaking software and may contain errors in syntax, spelling, or content which have not been identified prior to signing this note.

## 2023-03-11 NOTE — Progress Notes (Deleted)
NEUROLOGY FOLLOW UP OFFICE NOTE  Michelle Knight [id number]  Assessment/Plan:   Chronic migraine without aura, without status migrainosus, not intractable   Migraine prevention:  Topiramate 50mg  at bedtime *** Migraine rescue:  rizatriptan 10mg .  Will have her try Nurtec to take at work as it less likely may cause drowsiness.  She will let me know if effective.  Otherwise, we can try Ubrelvy *** Limit use of pain relievers to no more than 2 days out of week to prevent risk of rebound or medication-overuse headache. Keep headache diary Follow up 6 months ***       Subjective:  Michelle Knight is a 22 year old female who follows up for migraines.  UPDATE: Increased topiramate last September. *** Nurtec *** Intensity:  severe Duration:  5-10 minutes with rizatriptan.   Frequency:  5-6 a month (4 are severe) Current NSAIDS/analgesics:  none Current triptans:  rizatriptan 10mg  (effective but makes her sleepy - cannot take at work) Current ergotamine:  none Current anti-emetic:  none Current muscle relaxants:  none Current Antihypertensive medications:  none Current Antidepressant medications:  Lexapro Current Anticonvulsant medications:  topiramate 25mg  QHS Current anti-CGRP:  none Current Vitamins/Herbal/Supplements:  none Current Antihistamines/Decongestants:  none Other therapy:  none Hormone/birth control:  levonorgestrel  Caffeine:  rarely Diet:  5 to 6 bottles water daily.  Does not skip meals.  Tries to eat healthy Exercise:  a little bit Depression:  yes; Anxiety:  no Other pain:  no Sleep hygiene:  good   HISTORY:  Onset:  22 years old Location:  mostly frontal and left greater than left temple Quality:  pressure/throbbing Intensity:  8/10.   Aura:  absent Prodrome:  absent Associated symptoms:  Photophobia, sees dots and blurriness with quick head movements.  Sometimes difficulty taking a breath if severe.  Sometimes nausea.  She denies associated  vomiting, phonophobia, unilateral numbness or weakness. Duration:  2-5 hours Frequency:  daily Frequency of abortive medication: none Triggers:  unknown Relieving factors:  sit in dark place, shower Activity:  aggravates     Past NSAIDS/analgesics:  Ibuprofen, Excedrin, Tylenol Past abortive triptans:  sumatriptan tab Past abortive ergotamine:  none Past muscle relaxants:  none Past anti-emetic:  none Past antihypertensive medications:  none Past antidepressant medications:  none Past anticonvulsant medications:  none Past anti-CGRP:  none Past vitamins/Herbal/Supplements:  none Past antihistamines/decongestants:  none Other past therapies:  none    Family history of headache:  unknown.  Adopted.    PAST MEDICAL HISTORY: Past Medical History:  Diagnosis Date   Anemia    IUFD at 38 weeks or more of gestation     MEDICATIONS: Current Outpatient Medications on File Prior to Visit  Medication Sig Dispense Refill   Cholecalciferol (VITAMIN D-1000 MAX ST) 25 MCG (1000 UT) tablet Take by mouth.     escitalopram (LEXAPRO) 10 MG tablet Take 1 tablet (10 mg total) by mouth daily. This is in addition to 20mg . Total dose is 30mg  30 tablet 1   escitalopram (LEXAPRO) 20 MG tablet Take 1 tablet (20 mg total) by mouth daily. This is in addition to 10mg . Total dose is 30mg . Delete prior refills of lexapro 30 tablet 1   Fe Bisgly-Vit C-Vit B12-FA (GENTLE IRON PO) Take by mouth.     ibuprofen (ADVIL) 800 MG tablet Take 1 tablet (800 mg total) by mouth daily as needed. 21 tablet 0   levonorgestrel (LILETTA, 52 MG,) 20.1 MCG/DAY IUD 1 each by Intrauterine route once.  No current facility-administered medications on file prior to visit.    ALLERGIES: Allergies  Allergen Reactions   Other Itching, Other (See Comments), Swelling and Nausea And Vomiting    Certain exotic fruits...mango, dragonfruit, papaya,etc. Tongue swells.  Certain exotic fruits...mango, dragonfruit, papaya,etc.  Tongue swells.  Other reaction(s): Other (See Comments), Unknown  Certain fruits, unsure which ones  Grasses  Dust mites  Certain exotic fruits...mango, dragonfruit, papaya,etc. Tongue swells.  Certain fruits, unsure which ones  Grasses  Dust mites   Tramadol Hives and Rash    hives   Mixed Grasses Itching    FAMILY HISTORY: Family History  Adopted: Yes      Objective:  *** General: No acute distress.  Patient appears well-groomed.   Head:  Normocephalic/atraumatic Eyes:  Fundi examined but not visualized Neck: supple, no paraspinal tenderness, full range of motion Heart:  Regular rate and rhythm Neurological Exam: ***   Shon Millet, DO  CC: Gayleen Orem, MD

## 2023-03-14 ENCOUNTER — Ambulatory Visit: Payer: Medicaid Other | Admitting: Neurology

## 2023-03-14 ENCOUNTER — Encounter: Payer: Self-pay | Admitting: Neurology

## 2023-04-20 ENCOUNTER — Encounter (HOSPITAL_COMMUNITY): Payer: Self-pay | Admitting: Psychiatry

## 2023-04-20 ENCOUNTER — Telehealth (INDEPENDENT_AMBULATORY_CARE_PROVIDER_SITE_OTHER): Payer: BC Managed Care – PPO | Admitting: Psychiatry

## 2023-04-20 DIAGNOSIS — Z639 Problem related to primary support group, unspecified: Secondary | ICD-10-CM | POA: Diagnosis not present

## 2023-04-20 DIAGNOSIS — F332 Major depressive disorder, recurrent severe without psychotic features: Secondary | ICD-10-CM

## 2023-04-20 DIAGNOSIS — F53 Postpartum depression: Secondary | ICD-10-CM

## 2023-04-20 DIAGNOSIS — F331 Major depressive disorder, recurrent, moderate: Secondary | ICD-10-CM

## 2023-04-20 MED ORDER — ESCITALOPRAM OXALATE 10 MG PO TABS: 10.0000 mg | ORAL_TABLET | Freq: Every day | ORAL | 1 refills | Status: DC

## 2023-04-20 MED ORDER — BUSPIRONE HCL 7.5 MG PO TABS: 7.5000 mg | ORAL_TABLET | Freq: Every day | ORAL | 0 refills | Status: DC

## 2023-04-20 MED ORDER — ESCITALOPRAM OXALATE 20 MG PO TABS
20.0000 mg | ORAL_TABLET | Freq: Every day | ORAL | 1 refills | Status: DC
Start: 2023-04-20 — End: 2023-05-25

## 2023-04-20 NOTE — Progress Notes (Signed)
[facility] Follow up visit   Patient Identification: Michelle Knight MRN:  [id number] Date of Evaluation:  04/20/2023 Referral Source: primary care Chief Complaint:   No chief complaint on file. Follow up depression Visit Diagnosis:    ICD-10-CM   1. MDD (major depressive disorder), recurrent episode, moderate (HCC)  F33.1     2. Relationship dysfunction  Z63.9     3. Postpartum depression  F53.0 escitalopram (LEXAPRO) 20 MG tablet     Virtual Visit via Video Note  I connected with Michelle Knight on 04/20/23 at  1:30 PM EDT by a video enabled telemedicine application and verified that I am speaking with the correct person using two identifiers.  Location: Patient: parked car Provider: home office   I discussed the limitations of evaluation and management by telemedicine and the availability of in person appointments. The patient expressed understanding and agreed to proceed.     I discussed the assessment and treatment plan with the patient. The patient was provided an opportunity to ask questions and all were answered. The patient agreed with the plan and demonstrated an understanding of the instructions.   The patient was advised to call back or seek an in-person evaluation if the symptoms worsen or if the condition fails to improve as anticipated.  I provided 20 minutes of non-face-to-face time during this encounter.   History of Present Illness:  Patient is a 22 years old currently single [place] descent female who grew up with her adopted parents since age 45 months  she works Clinical biochemist,  Post partum  near 2 years  Was doing fair last visit but BF break up, has poor support now but has a friend and job which keeps busy, stressed at times, is in therapy   Aggravating factors relationship concerns with her adopted family. Postpartum Financial, had still birth in past Modifying factors; cousin, baby   Severity  manageable    Past Psychiatric History:  depression, anxiety  Previous Psychotropic Medications: Yes   Substance Abuse History in the last 12 months:  Yes.    Consequences of Substance Abuse: Sporadic use of alcohol, idsucssed its effect on depression  Past Medical History:  Past Medical History:  Diagnosis Date   Anemia    IUFD at 38 weeks or more of gestation     Past Surgical History:  Procedure Laterality Date   WISDOM TOOTH EXTRACTION      Family Psychiatric History: denies, was adapted  Family History:  Family History  Adopted: Yes    Social History:   Social History   Socioeconomic History   Marital status: Single    Spouse name: Not on file   Number of children: 1   Years of education: 12   Highest education level: Not on file  Occupational History   Not on file  Tobacco Use   Smoking status: Never   Smokeless tobacco: Never  Vaping Use   Vaping status: Never Used  Substance and Sexual Activity   Alcohol use: Not Currently   Drug use: Not Currently    Types: Marijuana    Comment: last use oct 2021   Sexual activity: Yes    Birth control/protection: I.U.D.  Other Topics Concern   Not on file  Social History Narrative   Right handed   Some caffeine   Two story home   Social Determinants of Health   Financial Resource Strain: Low Risk  (02/17/2022)   [facility]   Overall Financial Resource Strain (CARDIA)    Difficulty of Paying Living Expenses: Not hard at all  Food Insecurity: No Food Insecurity (02/17/2022)   [facility] visits prior to 09/04/2022[facility]   Hunger Vital Sign    Worried About Running Out of Food in the Last Year: Never true    Ran Out of Food in the Last Year: Never true  Transportation Needs: No  Transportation Needs (02/17/2022)   [facility] visits prior to 09/04/2022[facility]   PRAPARE - Transportation    Lack of Transportation (Medical): No    Lack of Transportation (Non-Medical): No  Physical Activity: Inactive (02/17/2022)   [facility] visits prior to 09/04/2022., [facility]   Exercise Vital Sign    Days of Exercise per Week: 0 days    Minutes of Exercise per Session: 0 min  Stress: No Stress Concern Present (02/17/2022)   [facility]04[facility] visits prior to 09/04/2022., [facility]   [employer] of Occupational Health - Occupational Stress Questionnaire    Feeling of Stress : Not at all  Social Connections: Unknown (02/17/2022)   [facility] prior to 09/04/2022., [facility] visits prior to 09/04/2022[facility] visits prior to 09/04/2022., [facility]   Social Connection and Isolation Panel [NHANES]    Frequency of Communication with Friends and Family: Not on file    Frequency of Social Gatherings with Friends and Family: Not on file    Attends Religious Services: Not on file    Active Member of Clubs or Organizations: Not on file    Attends Banker Meetings: Not on file    Marital Status: Married      Allergies:   Allergies  Allergen Reactions   Other Itching, Other (See Comments), Swelling and Nausea And Vomiting    Certain exotic fruits...mango, dragonfruit, papaya,etc. Tongue swells.  Certain exotic fruits...mango, dragonfruit, papaya,etc. Tongue swells.  Other reaction(s): Other (See Comments), Unknown  Certain  fruits, unsure which ones  Grasses  Dust mites  Certain exotic fruits...mango, dragonfruit, papaya,etc. Tongue swells.  Certain fruits, unsure which ones  Grasses  Dust mites   Tramadol Hives and Rash    hives   Mixed Grasses Itching    Metabolic Disorder Labs: Lab Results  Component Value Date   HGBA1C 5.3 11/11/2020   MPG 105 11/11/2020   No results found for: "PROLACTIN" No results found for: "CHOL", "TRIG", "HDL", "CHOLHDL", "VLDL", "LDLCALC" No results found for: "TSH"  Therapeutic Level Labs: No results found for: "LITHIUM" No results found for: "CBMZ" No results found for: "VALPROATE"  Current Medications: Current Outpatient Medications  Medication Sig Dispense Refill   busPIRone (BUSPAR) 7.5 MG tablet Take 1 tablet (7.5 mg total) by mouth daily. 30 tablet 0   Cholecalciferol (VITAMIN D-1000 MAX ST) 25 MCG (1000 UT) tablet Take by mouth.     escitalopram (LEXAPRO) 10 MG tablet Take 1 tablet (10 mg total) by mouth daily. This is in  addition to 20mg . Total dose is 30mg  30 tablet 1   escitalopram (LEXAPRO) 20 MG tablet Take 1 tablet (20 mg total) by mouth daily. This is in addition to 10mg . Total dose is 30mg . Delete prior refills of lexapro 30 tablet 1   Fe Bisgly-Vit C-Vit B12-FA (GENTLE IRON PO) Take by mouth.     ibuprofen (ADVIL) 800 MG tablet Take 1 tablet (800 mg total) by mouth daily as needed. 21 tablet 0   levonorgestrel (LILETTA, 52 MG,) 20.1 MCG/DAY IUD 1 each by Intrauterine route once.     No current facility-administered medications for this visit.     Psychiatric Specialty Exam: Review of Systems  Cardiovascular:  Negative for chest pain.  Neurological:  Negative for tremors.  Psychiatric/Behavioral:  Negative for agitation and dysphoric mood.     not currently breastfeeding.There is no height or weight on file to calculate BMI.  General Appearance: Casual  Eye Contact:  Fair  Speech:  Clear and Coherent  Volume:  Decreased  Mood:  fair   Affect:  Constricted  Thought Process:  Goal Directed  Orientation:  Full (Time, Place, and Person)  Thought Content:  Rumination  Suicidal Thoughts:  No  Homicidal Thoughts:  No  Memory:  Immediate;   Fair  Judgement:  Fair  Insight:  Shallow  Psychomotor Activity:  Decreased  Concentration:  Concentration: Fair  Recall:  Good  Fund of Knowledge:Good  Language: Good  Akathisia:  No  Handed:    AIMS (if indicated):  not done  Assets:  Desire for Improvement Housing  ADL's:  Intact  Cognition: WNL  Sleep:  Fair   Screenings: GAD-7    Flowsheet Row Initial Prenatal from [facility]11/2020 [facility] [employer]  Total GAD-7 Score 4      PHQ2-9    Flowsheet Row Office Visit from [facility]27/2021 [facility] Initial Prenatal from [facility]/11/2020 in [facility]  PHQ-2 Total Score 2 1  PHQ-9 Total Score 11 8      Flowsheet Row ED [facility]07/20[facility] in [facility] ED from 10/21/2022 in [facility] Video Visit from 05/05/2022 [facility]  C-SSRS RISK CATEGORY No Risk No Risk No Risk       Assessment and Plan: as follows  Prior documentation reviewed  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 Will continue lexapro add buspar for anxiety  Postpartum;  handling it but has less support or seperation, continue therpay and add buspar Relationship concerns: BF break up, dad doesn't help her either . Continue therapy and add buspar Work on Pharmacologist and has some friends for support  Fu 16m or earlier if needed   Thresa Ross, MD 10/16/20241:43 PM

## 2023-04-28 ENCOUNTER — Telehealth (HOSPITAL_COMMUNITY): Payer: Self-pay | Admitting: *Deleted

## 2023-04-28 NOTE — Telephone Encounter (Signed)
PATIENT HAS BEEN DENIED NUMEROUS TIMES FOR  busPIRone (BUSPAR) 7.5 MG tablet   CALLED  [employer] of [place] TO FIND WHAT'S ON THE PATIENT FORMULARY  THAT WILL BE COVERED --   busPIRone (BUSPAR) 5 MG tablet  busPIRone (BUSPAR) 10 MG tablet  busPIRone (BUSPAR) 15 MG tablet   CALL ATTEMPTED TO PATIENT  & VM SAYS MAIL BOX IS FULL.  REACHING OUT SO PATIENT COULD REACH OUT TO HER Rx TO SEE IF busPIRone (BUSPAR) 7.5 MG tablet   IS AFFORDABLE OUT OF POCKET?

## 2023-04-28 NOTE — Telephone Encounter (Signed)
DR Gilmore Laroche-- I can send the 5mg  if it is covered she can start one a day and if need two times a day. I can send as bid total 60 Let me know

## 2023-05-11 DIAGNOSIS — D72829 Elevated white blood cell count, unspecified: Secondary | ICD-10-CM | POA: Diagnosis not present

## 2023-05-11 DIAGNOSIS — J9801 Acute bronchospasm: Secondary | ICD-10-CM | POA: Diagnosis not present

## 2023-05-11 DIAGNOSIS — F17200 Nicotine dependence, unspecified, uncomplicated: Secondary | ICD-10-CM | POA: Diagnosis not present

## 2023-05-11 DIAGNOSIS — J069 Acute upper respiratory infection, unspecified: Secondary | ICD-10-CM | POA: Diagnosis not present

## 2023-05-25 ENCOUNTER — Encounter (HOSPITAL_COMMUNITY): Payer: Self-pay | Admitting: Psychiatry

## 2023-05-25 ENCOUNTER — Telehealth (HOSPITAL_COMMUNITY): Payer: BC Managed Care – PPO | Admitting: Psychiatry

## 2023-05-25 DIAGNOSIS — Z639 Problem related to primary support group, unspecified: Secondary | ICD-10-CM | POA: Diagnosis not present

## 2023-05-25 DIAGNOSIS — F331 Major depressive disorder, recurrent, moderate: Secondary | ICD-10-CM

## 2023-05-25 DIAGNOSIS — F53 Postpartum depression: Secondary | ICD-10-CM

## 2023-05-25 MED ORDER — ESCITALOPRAM OXALATE 20 MG PO TABS
20.0000 mg | ORAL_TABLET | Freq: Every day | ORAL | 1 refills | Status: DC
Start: 2023-05-25 — End: 2023-08-25

## 2023-05-25 MED ORDER — BUSPIRONE HCL 7.5 MG PO TABS: 7.5000 mg | ORAL_TABLET | Freq: Every day | ORAL | 2 refills | Status: DC

## 2023-05-25 MED ORDER — ESCITALOPRAM OXALATE 10 MG PO TABS: 10.0000 mg | ORAL_TABLET | Freq: Every day | ORAL | 1 refills | Status: DC

## 2023-05-25 NOTE — Progress Notes (Signed)
[facility] Follow up visit   Patient Identification: Michelle Knight MRN:  [id number] Date of Evaluation:  05/25/2023 Referral Source: primary care Chief Complaint:   No chief complaint on file. Follow up depression Visit Diagnosis:    ICD-10-CM   1. MDD (major depressive disorder), recurrent episode, moderate (HCC)  F33.1     2. Relationship dysfunction  Z63.9     3. Postpartum depression  F53.0 escitalopram (LEXAPRO) 20 MG tablet    Virtual Visit via Video Note  I connected with Michelle Knight on 05/25/23 at 11:00 AM EST by a video enabled telemedicine application and verified that I am speaking with the correct person using two identifiers.  Location: Patient: parked car Provider: home office   I discussed the limitations of evaluation and management by telemedicine and the availability of in person appointments. The patient expressed understanding and agreed to proceed.   I discussed the assessment and treatment plan with the patient. The patient was provided an opportunity to ask questions and all were answered. The patient agreed with the plan and demonstrated an understanding of the instructions.   The patient was advised to call back or seek an in-person evaluation if the symptoms worsen or if the condition fails to improve as anticipated.  I provided 20 minutes  of non-face-to-face time during this encounter.   History of Present Illness:  Patient is a 22 years old currently single [place] descent female who grew up with her adopted parents since age 19 months  she works Clinical biochemist,  Post partum  near 2 years  Doing fair going thru breakup with BF but handling it and discussing how to keep divided time for the baby Depression manageable, there is work stress but not worse    Aggravating factors relationship concerns with her adopted family. Postpartum Financial, had still birth in past Modifying factors; cousin, baby   Severity  fair    Past  Psychiatric History: depression, anxiety  Previous Psychotropic Medications: Yes   Substance Abuse History in the last 12 months:  Yes.    Consequences of Substance Abuse: Sporadic use of alcohol, idsucssed its effect on depression  Past Medical History:  Past Medical History:  Diagnosis Date   Anemia    IUFD at 38 weeks or more of gestation     Past Surgical History:  Procedure Laterality Date   WISDOM TOOTH EXTRACTION      Family Psychiatric History: denies, was adapted  Family History:  Family History  Adopted: Yes    Social History:   Social History   Socioeconomic History   Marital status: Single    Spouse name: Not on file   Number of children: 1   Years of education: 12   Highest education level: Not on file  Occupational History   Not on file  Tobacco Use   Smoking status: Never   Smokeless tobacco: Never  Vaping Use   Vaping status: Never Used  Substance and Sexual Activity   Alcohol use: Not Currently   Drug use: Not Currently    Types: Marijuana    Comment: last use oct 2021   Sexual activity: Yes    Birth control/protection: I.U.D.  Other Topics Concern   Not on file  Social History Narrative   Right handed   Some caffeine   Two story home   Social Determinants of Health   Financial Resource Strain: Low Risk  (05/19/2023)   [facility]   Overall Financial Resource Strain (CARDIA)  Difficulty of Paying Living Expenses: Not hard at all  Food Insecurity: No Food Insecurity (05/19/2023)   [facility]   Hunger Vital Sign    Worried About Running Out of Food in the Last Year: Never true    Ran Out of Food in the Last Year: Never true  Transportation Needs: No Transportation Needs (05/19/2023)   Received from [facility] - Transportation    Lack of Transportation (Medical): No    Lack of Transportation (Non-Medical): No  Physical Activity: Inactive (02/17/2022)   [facility] prior to 09/04/2022[facility] visits prior to 09/04/2022., [facility]   Exercise Vital Sign    Days of Exercise per Week: 0 days    Minutes of Exercise per Session: 0 min  Stress: No Stress Concern Present (02/17/2022)   [facility] visits prior to 09/04/2022., [facility] visits prior to 09/04/2022., [facility]   [employer] of Occupational Health - Occupational Stress Questionnaire    Feeling of Stress : Not at all  Social Connections: Unknown (02/17/2022)   [facility] visits prior to 09/04/2022., [facility] visits prior to 09/04/2022., [facility], [facility] visits prior to 09/04/2022., [facility]   Social Connection and Isolation Panel [NHANES]    Frequency of Communication with Friends and Family: Not on file    Frequency of Social Gatherings with Friends and Family: Not on file    Attends Religious Services: Not on file    Active Member of Clubs or Organizations: Not on file    Attends Banker Meetings: Not on file    Marital Status: Married      Allergies:   Allergies  Allergen Reactions   Other Itching, Other (See Comments), Swelling and Nausea And Vomiting    Certain exotic fruits...mango, dragonfruit, papaya,etc. Tongue swells.  Certain exotic fruits...mango, dragonfruit, papaya,etc. Tongue swells.  Other reaction(s): Other (See Comments), Unknown  Certain fruits, unsure which ones  Grasses  Dust mites  Certain exotic fruits...mango, dragonfruit, papaya,etc. Tongue swells.  Certain fruits, unsure which ones  Grasses  Dust mites   Tramadol Hives and Rash    hives   Mixed Grasses Itching    Metabolic Disorder Labs: Lab Results  Component Value Date   HGBA1C 5.3 11/11/2020   MPG 105 11/11/2020   No results found for:  "PROLACTIN" No results found for: "CHOL", "TRIG", "HDL", "CHOLHDL", "VLDL", "LDLCALC" No results found for: "TSH"  Therapeutic Level Labs: No results found for: "LITHIUM" No results found for: "CBMZ" No results found for: "VALPROATE"  Current Medications: Current Outpatient Medications  Medication Sig Dispense Refill   busPIRone (BUSPAR) 7.5 MG tablet Take 1 tablet (7.5 mg total) by mouth daily. 30 tablet 2   Cholecalciferol (VITAMIN D-1000 MAX ST) 25 MCG (1000 UT) tablet Take by mouth.     escitalopram (LEXAPRO) 10 MG tablet Take 1 tablet (10 mg total) by mouth daily. This is in addition to 20mg . Total dose is 30mg  30 tablet 1   escitalopram (LEXAPRO) 20 MG tablet Take 1 tablet (20 mg total) by mouth daily. This is in addition to 10mg . Total dose is 30mg . Delete prior refills of lexapro 30 tablet 1   Fe Bisgly-Vit C-Vit B12-FA (GENTLE IRON PO) Take by mouth.     ibuprofen (ADVIL) 800  MG tablet Take 1 tablet (800 mg total) by mouth daily as needed. 21 tablet 0   levonorgestrel (LILETTA, 52 MG,) 20.1 MCG/DAY IUD 1 each by Intrauterine route once.     No current facility-administered medications for this visit.     Psychiatric Specialty Exam: Review of Systems  Cardiovascular:  Negative for chest pain.  Neurological:  Negative for tremors.  Psychiatric/Behavioral:  Negative for agitation and dysphoric mood.     not currently breastfeeding.There is no height or weight on file to calculate BMI.  General Appearance: Casual  Eye Contact:  Fair  Speech:  Clear and Coherent  Volume:  Decreased  Mood:  fair  Affect:  Constricted  Thought Process:  Goal Directed  Orientation:  Full (Time, Place, and Person)  Thought Content:  Rumination  Suicidal Thoughts:  No  Homicidal Thoughts:  No  Memory:  Immediate;   Fair  Judgement:  Fair  Insight:  Shallow  Psychomotor Activity:  Decreased  Concentration:  Concentration: Fair  Recall:  Good  Fund of Knowledge:Good  Language: Good   Akathisia:  No  Handed:    AIMS (if indicated):  not done  Assets:  Desire for Improvement Housing  ADL's:  Intact  Cognition: WNL  Sleep:  Fair   Screenings: GAD-7    Flowsheet Row Initial Pren[facility]11/[facility] for [facility]  Total GAD-7 Score 4      PHQ2-9    Flowsheet Row Office Visit from 08/27/2021 in [facility] Initial Prenatal from 11/11/2020 in [facility]  PHQ-2 Total Score 2 1  PHQ-9 Total Score 11 8      Flowsheet Row Video Visit from 04/20/2023 in [facility] ED from [facility]/07/2023 in [facility] ED from 10/21/2022 in [facility] RISK CATEGORY No Risk No Risk No Risk       Assessment and Plan: as follows  Prior documentation reviewed  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

## 2023-07-20 DIAGNOSIS — Z23 Encounter for immunization: Secondary | ICD-10-CM | POA: Diagnosis not present

## 2023-07-20 DIAGNOSIS — E559 Vitamin D deficiency, unspecified: Secondary | ICD-10-CM | POA: Diagnosis not present

## 2023-07-20 DIAGNOSIS — R202 Paresthesia of skin: Secondary | ICD-10-CM | POA: Diagnosis not present

## 2023-07-20 DIAGNOSIS — D509 Iron deficiency anemia, unspecified: Secondary | ICD-10-CM | POA: Diagnosis not present

## 2023-07-20 DIAGNOSIS — Z1330 Encounter for screening examination for mental health and behavioral disorders, unspecified: Secondary | ICD-10-CM | POA: Diagnosis not present

## 2023-07-20 DIAGNOSIS — Z6841 Body Mass Index (BMI) 40.0 and over, adult: Secondary | ICD-10-CM | POA: Diagnosis not present

## 2023-07-20 DIAGNOSIS — Z0000 Encounter for general adult medical examination without abnormal findings: Secondary | ICD-10-CM | POA: Diagnosis not present

## 2023-07-20 DIAGNOSIS — E66813 Obesity, class 3: Secondary | ICD-10-CM | POA: Diagnosis not present

## 2023-07-20 DIAGNOSIS — G43E09 Chronic migraine with aura, not intractable, without status migrainosus: Secondary | ICD-10-CM | POA: Diagnosis not present

## 2023-08-03 DIAGNOSIS — G43E09 Chronic migraine with aura, not intractable, without status migrainosus: Secondary | ICD-10-CM | POA: Diagnosis not present

## 2023-08-09 DIAGNOSIS — G43E09 Chronic migraine with aura, not intractable, without status migrainosus: Secondary | ICD-10-CM | POA: Diagnosis not present

## 2023-08-24 ENCOUNTER — Encounter (HOSPITAL_COMMUNITY): Payer: BC Managed Care – PPO | Admitting: Psychiatry

## 2023-08-24 DIAGNOSIS — F331 Major depressive disorder, recurrent, moderate: Secondary | ICD-10-CM

## 2023-08-25 ENCOUNTER — Telehealth (INDEPENDENT_AMBULATORY_CARE_PROVIDER_SITE_OTHER): Payer: BC Managed Care – PPO | Admitting: Psychiatry

## 2023-08-25 ENCOUNTER — Encounter (HOSPITAL_COMMUNITY): Payer: Self-pay | Admitting: Psychiatry

## 2023-08-25 DIAGNOSIS — Z639 Problem related to primary support group, unspecified: Secondary | ICD-10-CM | POA: Diagnosis not present

## 2023-08-25 DIAGNOSIS — F53 Postpartum depression: Secondary | ICD-10-CM

## 2023-08-25 DIAGNOSIS — F331 Major depressive disorder, recurrent, moderate: Secondary | ICD-10-CM

## 2023-08-25 MED ORDER — BUSPIRONE HCL 7.5 MG PO TABS: 7.5000 mg | ORAL_TABLET | Freq: Every day | ORAL | 2 refills | Status: AC

## 2023-08-25 NOTE — Progress Notes (Signed)
[facility] Follow up visit   Patient Identification: Michelle Knight MRN:  [id number] Date of Evaluation:  08/25/2023 Referral Source: primary care Chief Complaint:   No chief complaint on file. Follow up depression Visit Diagnosis:    ICD-10-CM   1. MDD (major depressive disorder), recurrent episode, moderate (HCC)  F33.1     2. Relationship dysfunction  Z63.9     3. Postpartum depression  F53.0     Virtual Visit via Video Note  I connected with Michelle Knight on 08/25/23 at 10:30 AM EST by a video enabled telemedicine application and verified that I am speaking with the correct person using two identifiers.  Location: Patient: parked car Provider: home office   I discussed the limitations of evaluation and management by telemedicine and the availability of in person appointments. The patient expressed understanding and agreed to proceed.       I discussed the assessment and treatment plan with the patient. The patient was provided an opportunity to ask questions and all were answered. The patient agreed with the plan and demonstrated an understanding of the instructions.   The patient was advised to call back or seek an in-person evaluation if the symptoms worsen or if the condition fails to improve as anticipated.  I provided 18 minutes of non-face-to-face time during this encounter.       History of Present Illness:  Patient is a 23 years old currently single [place] descent female who grew up with her adopted parents since age 56 months  she works Clinical biochemist,  Post partum  near 2 years  Patient going thru breakup and now living with friends, handling it better and it was mutual Not taking lexapro felt it was causing some paranoia She feels buspar helping anxiety and is fine with that Denies depression  Doing fair going thru breakup with BF but handling it and discussing how to keep divided time for the baby    Aggravating factors relationship concerns  with her adopted family. Postpartum Financial, had still birth in past Modifying factors; cousin, baby   Severity  fair    Past Psychiatric History: depression, anxiety  Previous Psychotropic Medications: Yes   Substance Abuse History in the last 12 months:  Yes.    Consequences of Substance Abuse: Sporadic use of alcohol, idsucssed its effect on depression  Past Medical History:  Past Medical History:  Diagnosis Date   Anemia    IUFD at 38 weeks or more of gestation     Past Surgical History:  Procedure Laterality Date   WISDOM TOOTH EXTRACTION      Family Psychiatric History: denies, was adapted  Family History:  Family History  Adopted: Yes    Social History:   Social History   Socioeconomic History   Marital status: Single    Spouse name: Not on file   Number of children: 1   Years of education: 12   Highest education level: Not on file  Occupational History   Not on file  Tobacco Use   Smoking status: Never   Smokeless tobacco: Never  Vaping Use   Vaping status: Never Used  Substance and Sexual Activity   Alcohol use: Not Currently   Drug use: Not Currently    Types: Marijuana    Comment: last use oct 2021   Sexual activity: Yes    Birth control/protection: I.U.D.  Other Topics Concern   Not on file  Social History Narrative   Right handed   Some caffeine  Two story home   Social Drivers of Health   Financial Resource Strain: Low Risk  (07/20/2023)   [facility]   Overall Financial Resource Strain (CARDIA)    Difficulty of Paying Living Expenses: Not hard at all  Food Insecurity: No Food Insecurity (07/20/2023)   [facility]   Hunger Vital Sign    Worried About Running Out of Food in the Last Year: Never true    Ran Out of Food in the Last Year: Never true  Transportation Needs: No Transportation Needs (07/20/2023)   [facility]    Lack of Transportation  (Medical): No    Lack of Transportation (Non-Medical): No  Physical Activity: Inactive (02/17/2022)   [facility] visits prior to 09/04/2022[facility] visits prior to 09/04/2022., [facility]   Exercise Vital Sign    Days of Exercise per Week: 0 days    Minutes of Exercise per Session: 0 min  Stress: No Stress Concern Present (02/17/2022)   [facility] visits prior to 09/04/2022., [facility] visits prior to 09/04/2022., [facility]   [employer] of Occupational Health - Occupational Stress Questionnaire    Feeling of Stress : Not at all  Social Connections: Unknown (02/17/2022)   [facility] visits prior to 09/04/2022., [facility] visits prior to 09/04/2022., [facility] visits prior to 09/04/2022., [facility]   Social Connection and Isolation Panel [NHANES]    Frequency of Communication with Friends and Family: Not on file    Frequency of Social Gatherings with Friends and Family: Not on file    Attends Religious Services: Not on file    Active Member of Clubs or Organizations: Not on file    Attends Banker Meetings: Not on file    Marital Status: Married      Allergies:   Allergies  Allergen Reactions   Other Itching, Other (See Comments), Swelling and Nausea And Vomiting    Certain exotic fruits...mango, dragonfruit, papaya,etc. Tongue swells.  Certain exotic fruits...mango, dragonfruit, papaya,etc. Tongue swells.  Other reaction(s): Other (See Comments), Unknown  Certain fruits, unsure which ones  Grasses  Dust mites  Certain exotic fruits...mango, dragonfruit, papaya,etc. Tongue swells.  Certain fruits, unsure which ones  Grasses  Dust mites   Tramadol Hives and Rash    hives   Mixed Grasses Itching    Metabolic  Disorder Labs: Lab Results  Component Value Date   HGBA1C 5.3 11/11/2020   MPG 105 11/11/2020   No results found for: "PROLACTIN" No results found for: "CHOL", "TRIG", "HDL", "CHOLHDL", "VLDL", "LDLCALC" No results found for: "TSH"  Therapeutic Level Labs: No results found for: "LITHIUM" No results found for: "CBMZ" No results found for: "VALPROATE"  Current Medications: Current Outpatient Medications  Medication Sig Dispense Refill   busPIRone (BUSPAR) 7.5 MG tablet Take 1 tablet (7.5 mg total) by mouth daily. 30 tablet 2   Cholecalciferol (VITAMIN D-1000 MAX ST) 25 MCG (1000 UT) tablet Take by mouth.     Fe Bisgly-Vit C-Vit B12-FA (GENTLE IRON PO) Take by mouth.     ibuprofen (ADVIL) 800 MG tablet Take 1 tablet (800 mg total) by mouth daily as needed. 21 tablet 0   levonorgestrel (LILETTA, 52 MG,) 20.1 MCG/DAY IUD 1 each by Intrauterine  route once.     No current facility-administered medications for this visit.     Psychiatric Specialty Exam: Review of Systems  Cardiovascular:  Negative for chest pain.  Neurological:  Negative for tremors.  Psychiatric/Behavioral:  Negative for agitation and dysphoric mood.     There were no vitals taken for this visit.There is no height or weight on file to calculate BMI.  General Appearance: Casual  Eye Contact:  Fair  Speech:  Clear and Coherent  Volume:  Decreased  Mood:  fair  Affect:  Constricted  Thought Process:  Goal Directed  Orientation:  Full (Time, Place, and Person)  Thought Content:  Rumination  Suicidal Thoughts:  No  Homicidal Thoughts:  No  Memory:  Immediate;   Fair  Judgement:  Fair  Insight:  Shallow  Psychomotor Activity:  Decreased  Concentration:  Concentration: Fair  Recall:  Good  Fund of Knowledge:Good  Language: Good  Akathisia:  No  Handed:    AIMS (if indicated):  not done  Assets:  Desire for Improvement Housing  ADL's:  Intact  Cognition: WNL  Sleep:  Fair   Screenings: GAD-7     Flowsheet Row Initial Prenatal from [facility]/11/2020 in [facility] for [facility] [employer] [facility]  Total GAD-7 Score 4      PHQ2-9    Flowsheet Row Office Visit from 08/27/2021 in [facility] Initial Prenatal from 11/11/2020 in [facility]  PHQ-2 Total Score 2 1  PHQ-9 Total Score 11 8      Flowsheet Row Video Visit from 04/20/2023 in [facility] ED from [facility]/07/2023 in [facility] ED from 10/21/2022 in [facility] RISK CATEGORY No Risk No Risk No Risk       Assessment and Plan: as follows  Prior documentation reviewed  Major depressive disorder recurrent moderate to severe; manageable without meds, is in therapy and doing fair, does not want to be on meds  GAD: manageable on buspar, can continue refills sent  Realtionbship concerns: now separated and is doing better   Continue therapy  FU 4m    Thresa Ross, MD 2/20/202510:42 AM

## 2023-10-24 ENCOUNTER — Encounter (HOSPITAL_COMMUNITY): Payer: Self-pay | Admitting: Psychiatry

## 2023-10-24 NOTE — Progress Notes (Signed)
 Erroneous encounter

## 2023-10-27 ENCOUNTER — Ambulatory Visit: Admitting: Obstetrics and Gynecology

## 2023-10-27 NOTE — Progress Notes (Deleted)
   ANNUAL EXAM Patient name: Michelle Knight MRN [id number]  Date of birth: 03-21-2001 Chief Complaint:   No chief complaint on file.  History of Present Illness:   Michelle Knight is a 23 y.o. G61P2011 female being seen today for a routine annual exam.   Current concerns: ***  Current birth control: IUD placed 07/2021 (Liletta )  No LMP recorded. (Menstrual status: IUD).  Gardasil: ***  Last Pap/Pap History:  12/2020: normal pap   Health Maintenance Due  Topic Date Due   HPV VACCINES (1 - 3-dose series) Never done   COVID-19 Vaccine (1 - 2024-25 season) Never done   Cervical Cancer Screening (Pap smear)  12/10/2023    Review of Systems:   Pertinent items are noted in HPI Denies any headaches, blurred vision, fatigue, shortness of breath, chest pain, abdominal pain, abnormal vaginal discharge/itching/odor/irritation, problems with periods, bowel movements, urination, or intercourse unless otherwise stated above. *** Pertinent History Reviewed:  Reviewed past medical,surgical, social and family history.  Reviewed problem list, medications and allergies. Physical Assessment:  There were no vitals filed for this visit.There is no height or weight on file to calculate BMI.   Physical Examination:  General appearance - well appearing, and in no distress Mental status - alert, oriented to person, place, and time Psych:  She has a normal mood and affect Skin - warm and dry, normal color, no suspicious lesions noted Chest - effort normal Heart - normal rate  Breasts - breasts appear normal, no suspicious masses, no skin or nipple changes or axillary nodes Abdomen - soft, nontender, nondistended, no masses or organomegaly Pelvic -  VULVA: normal appearing vulva with no masses, tenderness or lesions  VAGINA: normal appearing vagina with normal color and discharge, no lesions  CERVIX: normal appearing cervix without discharge or lesions, no CMT UTERUS: uterus is felt to be normal  size, shape, consistency and nontender  ADNEXA: No adnexal masses or tenderness noted. Extremities:  No swelling or varicosities noted  Chaperone present for exam  No results found for this or any previous visit (from the past 24 hours).  Assessment & Plan:  Diagnoses and all orders for this visit:  Encounter for annual routine gynecological examination  - Cervical cancer screening: Discussed screening Q3 years. Reviewed importance of annual exams and limits of pap smear. Pap with reflex HPV done - GC/CT: Discussed and recommended. Pt  {Blank single:19197::"accepts","declines"} - Gardasil: {Blank single:19197::"***","has not yet had. Will provide information","completed","has not yet had. Counseling provided and she declines","Has not yet had. Counseling provided and pt accepts"} - Birth Control: IUD - Breast Health: Encouraged self breast awareness/exams. Teaching provided. - Follow-up: 12 months and prn     No orders of the defined types were placed in this encounter.   Meds: No orders of the defined types were placed in this encounter.   Follow-up: No follow-ups on file.  Lacey Pian, MD 10/27/2023 12:40 PM

## 2023-11-01 ENCOUNTER — Ambulatory Visit: Admitting: Obstetrics and Gynecology

## 2023-11-15 ENCOUNTER — Ambulatory Visit: Admitting: Obstetrics and Gynecology

## 2023-11-15 ENCOUNTER — Telehealth: Payer: Self-pay | Admitting: *Deleted

## 2023-11-15 NOTE — Progress Notes (Deleted)
 GYNECOLOGY ANNUAL PREVENTATIVE CARE ENCOUNTER NOTE  History:      Michelle Knight is a 23 y.o. [id number] female here for a routine annual gynecologic exam.  Current complaints: ***.   Denies abnormal vaginal bleeding, discharge, pelvic pain, problems with intercourse or other gynecologic concerns.    Gynecologic History No LMP recorded. (Menstrual status: IUD). Contraception: {method:5051} Last Pap: 2022. Result was normal with negative HPV Last Mammogram: ***.  Result was {norm/abn:16337} Last Colonoscopy: ***.  Result was {norm/abn:16337}  Obstetric History OB History  Gravida Para Term Preterm AB Living  3 2 2  1 1   SAB IAB Ectopic Multiple Live Births  1   0 1    # Outcome Date GA Lbr Len/2nd Weight Sex Type Anes PTL Lv  3 Term 06/09/21 [redacted]w[redacted]d 08:13 / 00:55 6 lb 10.2 oz (3.01 kg) M Vag-Spont EPI  LIV  2 SAB 2021 [redacted]w[redacted]d         1 Term 2018 [redacted]w[redacted]d       FD    Past Medical History:  Diagnosis Date   Anemia    IUFD at 38 weeks or more of gestation     Past Surgical History:  Procedure Laterality Date   WISDOM TOOTH EXTRACTION      Current Outpatient Medications on File Prior to Visit  Medication Sig Dispense Refill   busPIRone  (BUSPAR ) 7.5 MG tablet Take 1 tablet (7.5 mg total) by mouth daily. 30 tablet 2   Cholecalciferol (VITAMIN D-1000 MAX ST) 25 MCG (1000 UT) tablet Take by mouth.     Fe Bisgly-Vit C-Vit B12-FA (GENTLE IRON  PO) Take by mouth.     ibuprofen  (ADVIL ) 800 MG tablet Take 1 tablet (800 mg total) by mouth daily as needed. 21 tablet 0   levonorgestrel  (LILETTA , 52 MG,) 20.1 MCG/DAY IUD 1 each by Intrauterine route once.     No current facility-administered medications on file prior to visit.    Allergies  Allergen Reactions   Other Itching, Other (See Comments), Swelling and Nausea And Vomiting    Certain exotic fruits...mango, dragonfruit, papaya,etc. Tongue swells.  Certain exotic fruits...mango, dragonfruit, papaya,etc. Tongue swells.  Other  reaction(s): Other (See Comments), Unknown  Certain fruits, unsure which ones  Grasses  Dust mites  Certain exotic fruits...mango, dragonfruit, papaya,etc. Tongue swells.  Certain fruits, unsure which ones  Grasses  Dust mites   Tramadol Hives and Rash    hives   Mixed Grasses Itching    Social History:  reports that she has never smoked. She has never used smokeless tobacco. She reports that she does not currently use alcohol. She reports that she does not currently use drugs after having used the following drugs: Marijuana.  Family History  Adopted: Yes    The following portions of the patient's history were reviewed and updated as appropriate: allergies, current medications, past family history, past medical history, past social history, past surgical history and problem list.  Review of Systems Pertinent items noted in HPI and remainder of comprehensive ROS otherwise negative.  Physical Exam:  There were no vitals taken for this visit. CONSTITUTIONAL: Well-developed, well-nourished female in no acute distress.  HENT:  Normocephalic, atraumatic, External right and left ear normal.  EYES: Conjunctivae and EOM are normal. Pupils are equal, round, and reactive to light. No scleral icterus.  NECK: Normal range of motion, supple, no masses observed. SKIN: Skin is warm and dry. No rash noted. Not diaphoretic. No erythema. No pallor. MUSCULOSKELETAL: Normal range of motion.  No tenderness.  No cyanosis, clubbing, or edema. NEUROLOGIC: Alert and oriented to person, place, and time. Normal muscle tone coordination.  PSYCHIATRIC: Normal mood and affect. Normal behavior. Normal judgment and thought content. CARDIOVASCULAR: Normal heart rate noted, regular rhythm RESPIRATORY: Clear to auscultation bilaterally. Effort and breath sounds normal, no problems with respiration noted. BREASTS: Symmetric in size. No masses, tenderness, skin changes, nipple drainage, or lymphadenopathy bilaterally.  Performed in the presence of a chaperone. ABDOMEN: Soft, no distention noted.  No tenderness, rebound or guarding.  PELVIC: Normal appearing external genitalia and urethral meatus; normal appearing vaginal mucosa and cervix.  No abnormal vaginal discharge noted.  Pap smear obtained.  Normal uterine size, no other palpable masses, no uterine or adnexal tenderness.  Performed in the presence of a chaperone.   Assessment and Plan:    There are no diagnoses linked to this encounter. Will follow up results of pap smear and manage accordingly. Normal breast examination today, she was advised to perform periodic self breast examinations.  Mammogram scheduled for breast cancer screening. Colon cancer screening is up to date***Discussed need for colon cancer screening over the age 72. Colonoscopy recommended as this is the gold standard. Referral made to Gastroenterology for colonoscopy*** Patient declined colonoscopy, so discussed Cologuard.   Emphasized that positive Cologuard tests will need to be follow up with diagnostic colonoscopy. Cologuard ordered.***Patient will decide and let us  know her decision. Routine preventative health maintenance measures emphasized. Please refer to After Visit Summary for other counseling recommendations.      Biochemist, clinical for [employer], [facility]

## 2023-11-15 NOTE — Telephone Encounter (Signed)
 Left patient an urgent message to see if she can move appointment up due to some afternoon cancellations or stay at scheduled time.

## 2023-11-22 ENCOUNTER — Encounter (HOSPITAL_COMMUNITY): Payer: Self-pay

## 2023-11-22 ENCOUNTER — Telehealth (HOSPITAL_COMMUNITY): Payer: BC Managed Care – PPO | Admitting: Psychiatry

## 2023-12-24 IMAGING — DX DG CHEST 2V
2 series · 2 of 2 positions shown · non-contrast
Comparison: None Available.

CLINICAL DATA: Shortness of breath, chest pain

EXAM:
CHEST - 2 VIEW

[chest pa]
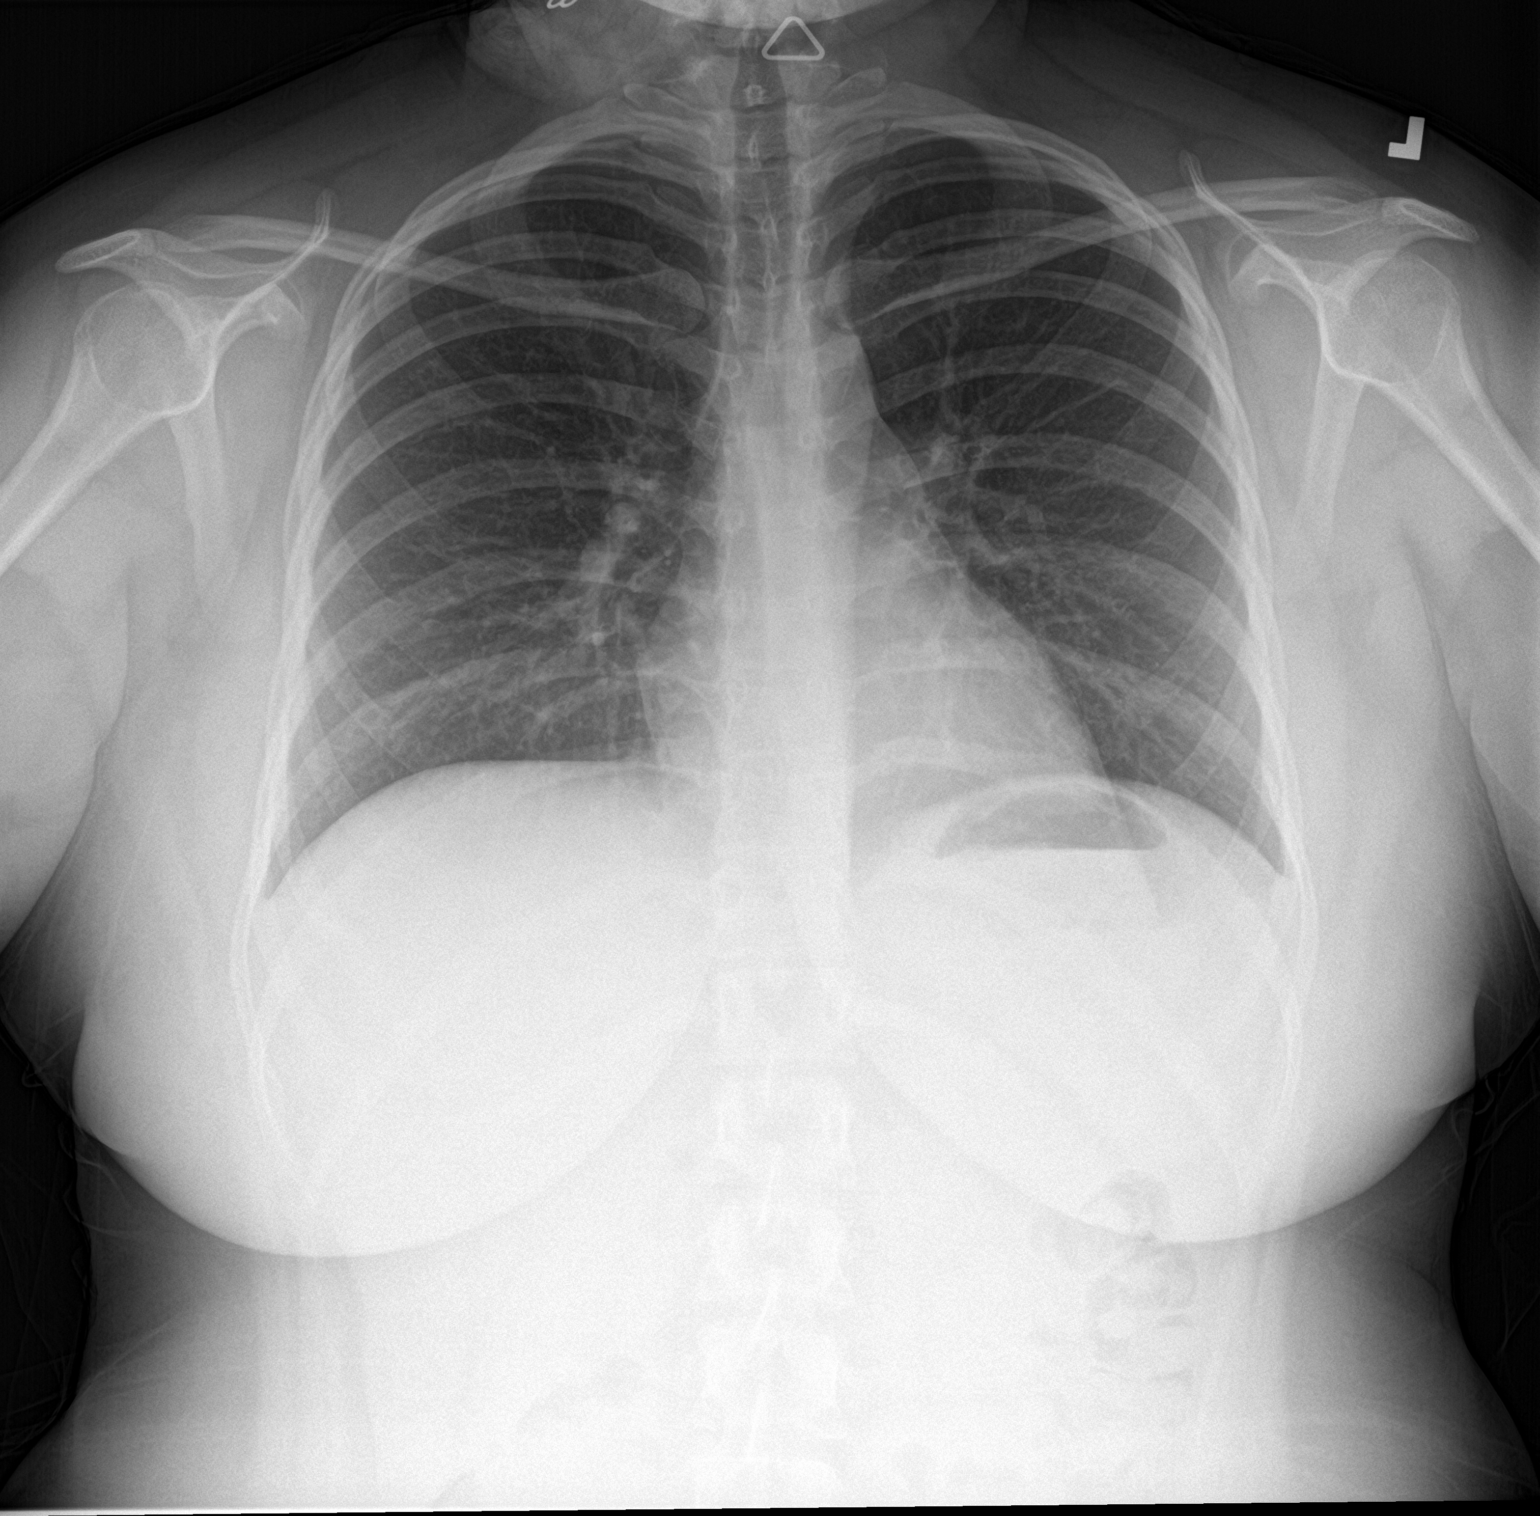

[chest lat]
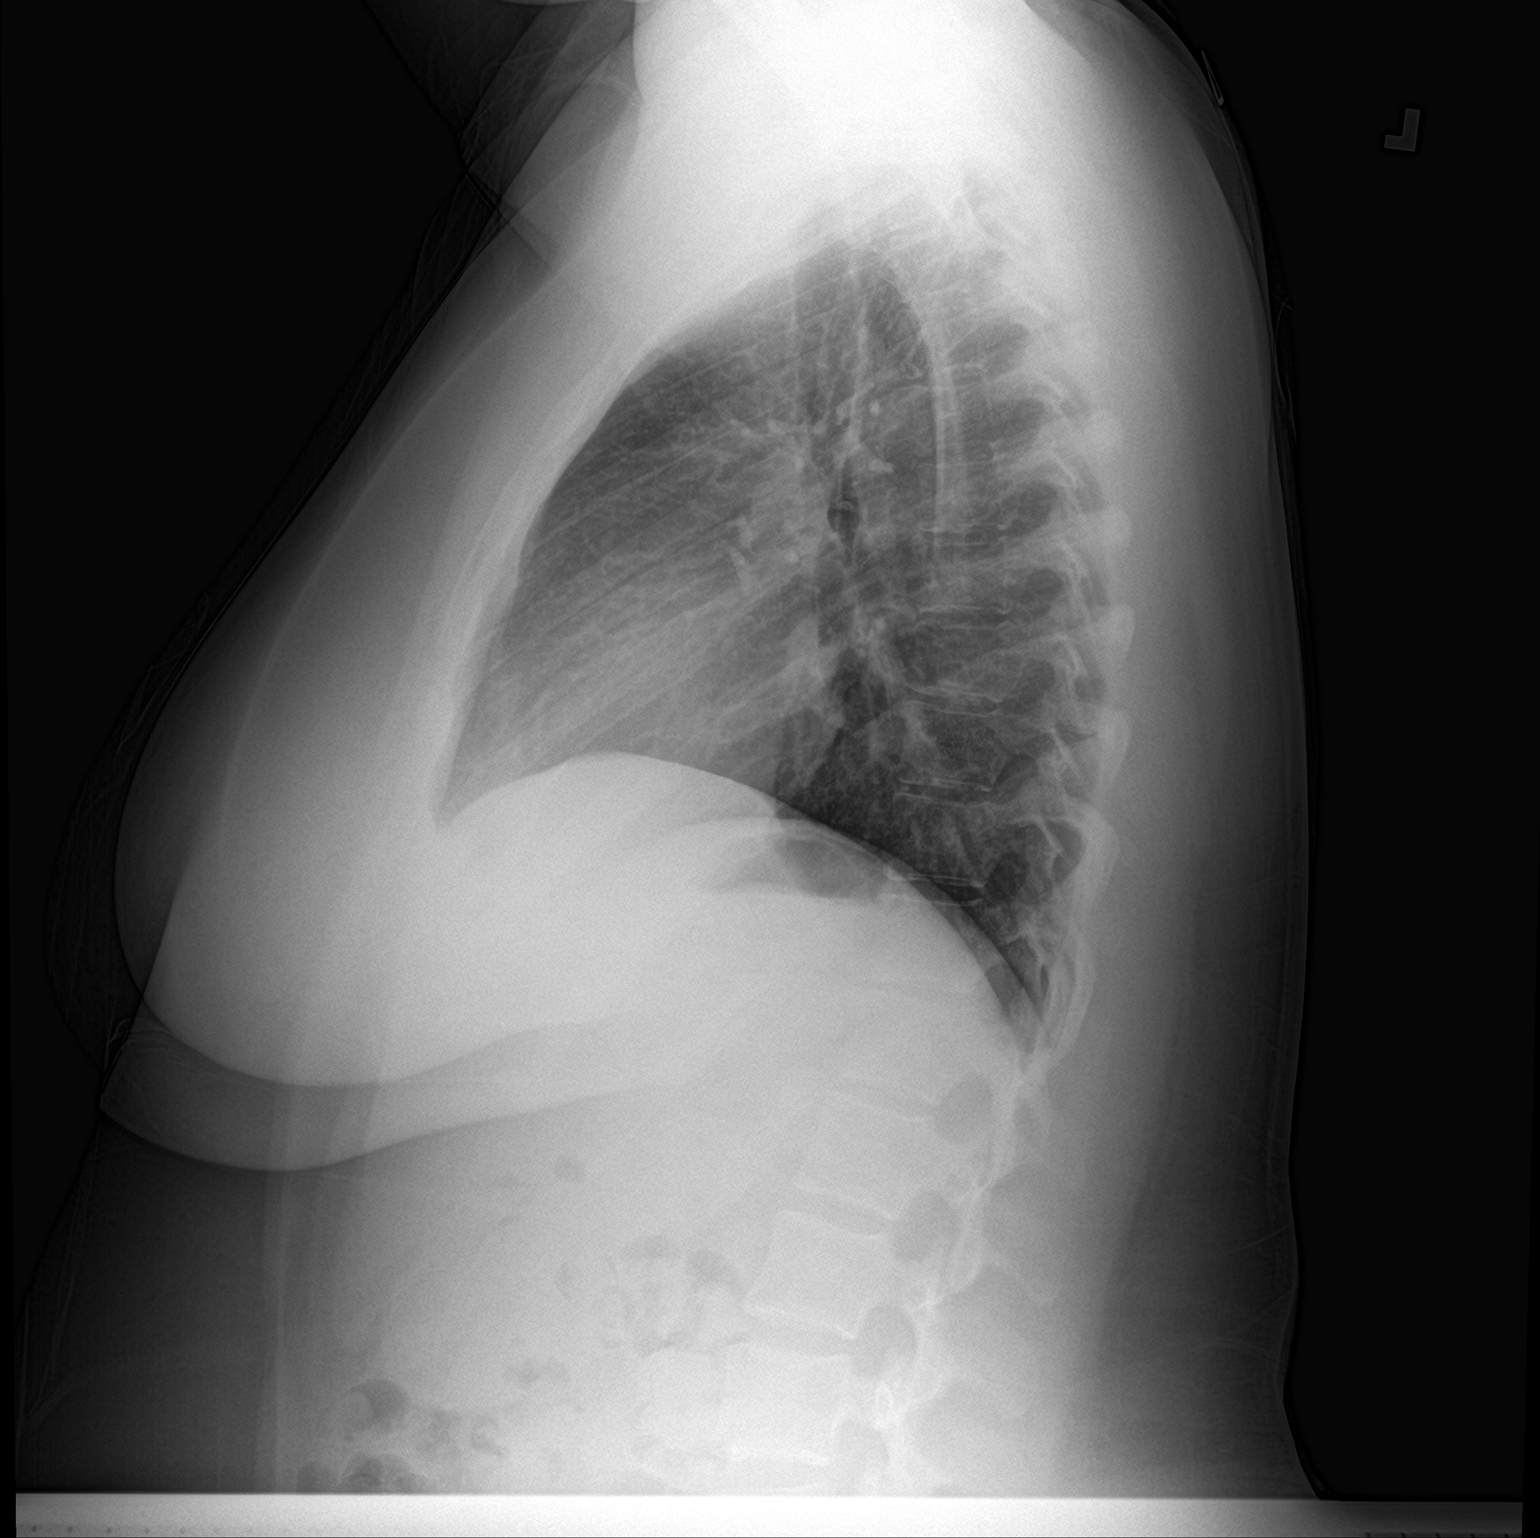

[2 of 2 positions shown; findings below may reference images not displayed]

FINDINGS: The heart size and mediastinal contours are within normal limits.
Both lungs are clear. The visualized skeletal structures are
unremarkable.
IMPRESSION: No acute abnormality of the lungs.

## 2024-04-04 DEATH — deceased
